# Patient Record
Sex: Female | Born: 1970 | Race: White | Hispanic: No | Marital: Married | Smoking: Never smoker
Health system: Southern US, Community
[De-identification: ages and names within clinical notes are randomized; demographics above are authoritative.]

## PROBLEM LIST (undated history)

## (undated) DIAGNOSIS — B019 Varicella without complication: Secondary | ICD-10-CM

## (undated) DIAGNOSIS — I10 Essential (primary) hypertension: Secondary | ICD-10-CM

## (undated) DIAGNOSIS — E785 Hyperlipidemia, unspecified: Secondary | ICD-10-CM

## (undated) DIAGNOSIS — F419 Anxiety disorder, unspecified: Secondary | ICD-10-CM

## (undated) DIAGNOSIS — E119 Type 2 diabetes mellitus without complications: Secondary | ICD-10-CM

## (undated) DIAGNOSIS — J45909 Unspecified asthma, uncomplicated: Secondary | ICD-10-CM

## (undated) HISTORY — DX: Type 2 diabetes mellitus without complications: E11.9

## (undated) HISTORY — DX: Essential (primary) hypertension: I10

## (undated) HISTORY — PX: POLYPECTOMY: SHX149

## (undated) HISTORY — DX: Varicella without complication: B01.9

## (undated) HISTORY — DX: Hyperlipidemia, unspecified: E78.5

## (undated) HISTORY — DX: Unspecified asthma, uncomplicated: J45.909

---

## 2009-08-26 ENCOUNTER — Emergency Department: Payer: Self-pay | Admitting: Emergency Medicine

## 2009-09-08 ENCOUNTER — Ambulatory Visit: Payer: Self-pay

## 2009-10-01 ENCOUNTER — Ambulatory Visit: Payer: Self-pay

## 2010-05-03 HISTORY — PX: POLYPECTOMY: SHX149

## 2010-09-09 ENCOUNTER — Ambulatory Visit: Payer: Self-pay | Admitting: Obstetrics and Gynecology

## 2010-09-21 ENCOUNTER — Ambulatory Visit: Payer: Self-pay | Admitting: Obstetrics and Gynecology

## 2010-09-24 ENCOUNTER — Ambulatory Visit: Payer: Self-pay | Admitting: Obstetrics and Gynecology

## 2010-09-29 LAB — PATHOLOGY REPORT

## 2012-08-01 ENCOUNTER — Ambulatory Visit: Payer: Self-pay | Admitting: Orthopedic Surgery

## 2012-08-21 ENCOUNTER — Emergency Department: Payer: Self-pay | Admitting: Emergency Medicine

## 2012-08-25 ENCOUNTER — Ambulatory Visit: Payer: Self-pay | Admitting: Orthopedic Surgery

## 2014-11-26 ENCOUNTER — Ambulatory Visit (INDEPENDENT_AMBULATORY_CARE_PROVIDER_SITE_OTHER): Payer: BC Managed Care – PPO | Admitting: Primary Care

## 2014-11-26 ENCOUNTER — Encounter: Payer: Self-pay | Admitting: Primary Care

## 2014-11-26 ENCOUNTER — Encounter (INDEPENDENT_AMBULATORY_CARE_PROVIDER_SITE_OTHER): Payer: Self-pay

## 2014-11-26 VITALS — BP 118/68 | HR 107 | Temp 98.7°F | Ht 60.0 in | Wt 266.8 lb

## 2014-11-26 DIAGNOSIS — F418 Other specified anxiety disorders: Secondary | ICD-10-CM

## 2014-11-26 DIAGNOSIS — R7303 Prediabetes: Secondary | ICD-10-CM

## 2014-11-26 DIAGNOSIS — E1165 Type 2 diabetes mellitus with hyperglycemia: Secondary | ICD-10-CM | POA: Insufficient documentation

## 2014-11-26 DIAGNOSIS — I10 Essential (primary) hypertension: Secondary | ICD-10-CM

## 2014-11-26 DIAGNOSIS — J45909 Unspecified asthma, uncomplicated: Secondary | ICD-10-CM | POA: Insufficient documentation

## 2014-11-26 DIAGNOSIS — E785 Hyperlipidemia, unspecified: Secondary | ICD-10-CM | POA: Diagnosis not present

## 2014-11-26 DIAGNOSIS — F411 Generalized anxiety disorder: Secondary | ICD-10-CM | POA: Insufficient documentation

## 2014-11-26 DIAGNOSIS — R7309 Other abnormal glucose: Secondary | ICD-10-CM | POA: Diagnosis not present

## 2014-11-26 DIAGNOSIS — E119 Type 2 diabetes mellitus without complications: Secondary | ICD-10-CM | POA: Insufficient documentation

## 2014-11-26 NOTE — Assessment & Plan Note (Signed)
Works as [employer] and will have situational anxiety occasionally during school year. No anxiety today. managed on 0.25 mg of Xanax by prior PCP prn. Will continue to monitor.

## 2014-11-26 NOTE — Assessment & Plan Note (Signed)
Managed on Hyzaar and amlodipine 5 mg. BP stable today. Denies headaches, dizziness, chest pain. Will continue to monitor. Labs next visit.

## 2014-11-26 NOTE — Progress Notes (Signed)
Pre visit review using our clinic review tool, if applicable. No additional management support is needed unless otherwise documented below in the visit note. 

## 2014-11-26 NOTE — Assessment & Plan Note (Signed)
Diagnosed several years ago. Mild. Uses albuterol inhaler once every 2-3 weeks. Asthma will flare during cold season. No complaints today. Will continue to monitor.

## 2014-11-26 NOTE — Assessment & Plan Note (Signed)
Endorses A1C of 6.1 on June 13th. Managed on glyburide 5 mg from prior PCP. Discussed importance of healthy diet and regular exercise. Will recheck A1C in mid September.

## 2014-11-26 NOTE — Progress Notes (Signed)
Subjective:    Patient ID: Destiny West, female    DOB: Feb 05, 1971, 44 y.o.   MRN: [id number]  HPI  Ms. Destiny West is a 44 year old female who presents today to establish care and discuss the problems mentioned below. Will obtain old records.  1) Borderline Diabetes: Diagnosed several years ago. Last A1C was 6.1 in June 13th 2016. Managed on glyburide 5 mg. She finds it difficult to eat a healthy diet and exercise.  Diet consists of: Breakfast: Banana Lunch: Yogurt, fruits, vegetables Dinner: Grilled chicken or salmon or ground beef, vegetables, rice, potatoes. Limited pasta consumption. Snacks: Chips, popcorn, fruit. Rarely eats sweets. Beverages: Water, seltzer.  She is currently not exercising. Back injury several years. She does have a fit bit and will do 10,000 steps daily when in school.   2) Hypertension: Diagnosed March 2015. Managed on Amlodipine 5 mg (added this May 2016) and Hyzarr 50/12.5 mg. Overall she feels well on these medication. Her BP on the Hyzarr alone was 140's/90. Denies dizziness, headaches, weakness.  3) Hyperlipidemia: Managed on simvastatin 20 mg. This was initiated in May of 2016. Denies myalgias.  4) Anxiety: Managed on 0.25 mg of Xanax as needed. 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 Prior history of Xanax use, was recently re-initiated on this medication. Does not take daily when in school. Is not taking during summer break.  5) Asthma: Mild. Worse when having a viral cold. She will have to use her inhaler once every 2 weeks on average.  Review of Systems  Constitutional: Negative for unexpected weight change.  HENT: Negative for rhinorrhea.   Respiratory: Negative for cough and shortness of breath.   Cardiovascular: Negative for chest pain.  Gastrointestinal: Negative for diarrhea and constipation.  Genitourinary: Negative for difficulty urinating.  Musculoskeletal: Negative for myalgias and arthralgias.     History of back pain. Has bulging disc at L4 and L5, no pain currently  Skin: Negative for rash.  Allergic/Immunologic: Positive for environmental allergies.  Neurological: Negative for dizziness and headaches.  Psychiatric/Behavioral:       See HPI. No concerns for depression       Past Medical History  Diagnosis Date  . Asthma   . Chicken pox   . Diabetes mellitus without complication     boderline  . Hyperlipidemia   . Hypertension     History   Social History  . Marital Status: Married    Spouse Name: N/A  . Number of Children: N/A  . Years of Education: N/A   Occupational History  . Not on file.   Social History Main Topics  . Smoking status: Never Smoker   . Smokeless tobacco: Not on file  . Alcohol Use: 0.0 oz/week    0 Standard drinks or equivalent per week     Comment: rarely  . Drug Use: Not on file  . Sexual Activity: Not on file   Other Topics Concern  . Not on file   Social History Narrative   Married.   No children.   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   Enjoys reading, traveling, movies.    Past Surgical History  Procedure Laterality Date  . Polypectomy      Uterine    Family History  Problem Relation Age of Onset  . Cancer Mother     breast  . Heart disease Father   . Hypertension Father   .  Heart disease Maternal Grandfather   . Hypertension Maternal Grandfather   . Diabetes Maternal Grandfather     No Known Allergies  No current outpatient prescriptions on file prior to visit.   No current facility-administered medications on file prior to visit.    BP 118/68 mmHg  Pulse 107  Temp(Src) 98.7 F (37.1 C) (Oral)  Ht 5' (1.524 m)  Wt 266 lb 12.8 oz (121.02 kg)  BMI 52.11 kg/m2  SpO2 98%  LMP 11/08/2014    Objective:   Physical Exam  Constitutional: She is oriented to person, place, and time. She appears well-nourished.  HENT:  Head: Normocephalic.  Cardiovascular: Normal rate and  regular rhythm.   Pulmonary/Chest: Effort normal and breath sounds normal. She has no wheezes.  Musculoskeletal: Normal range of motion.  Neurological: She is alert and oriented to person, place, and time.  Skin: Skin is warm and dry.  Psychiatric: She has a normal mood and affect.          Assessment & Plan:

## 2014-11-26 NOTE — Assessment & Plan Note (Signed)
Managed on simvastatin 20 mg.  Will repeat lipids in mid September. Discussed importance of healthy diet and regular exercise.

## 2014-11-26 NOTE — Patient Instructions (Addendum)
It is important that you improve your diet. Please limit carbohydrates in the form of white bread, rice, pasta, cakes, cookies, sugary drinks, etc. Increase your consumption of fresh fruits and vegetables. Be sure to drink plenty of water daily.  Schedule a lab appointment after September 13th to have your diabetes and cholesterol checked.   Schedule an appointment to meet with me 2-3 days later at your convenience so we can discuss your results and next steps.  It was a pleasure to meet you today! Please don't hesitate to call me with any questions. Welcome to [facility]!

## 2014-12-11 ENCOUNTER — Encounter: Payer: Self-pay | Admitting: Primary Care

## 2014-12-11 ENCOUNTER — Ambulatory Visit (INDEPENDENT_AMBULATORY_CARE_PROVIDER_SITE_OTHER): Payer: BC Managed Care – PPO | Admitting: Primary Care

## 2014-12-11 VITALS — BP 122/76 | HR 123 | Temp 98.4°F | Ht 60.0 in | Wt 264.8 lb

## 2014-12-11 DIAGNOSIS — M5489 Other dorsalgia: Secondary | ICD-10-CM

## 2014-12-11 NOTE — Progress Notes (Signed)
Pre visit review using our clinic review tool, if applicable. No additional management support is needed unless otherwise documented below in the visit note. 

## 2014-12-11 NOTE — Progress Notes (Signed)
Subjective:    Patient ID: Destiny West, female    DOB: Mar 15, 1971, 44 y.o.   MRN: [id number]  HPI  Destiny West is a 44 year old female who presents today with a chief complaint of back pain. Her pain is located to the left upper part of her back distal to scapula and has been present for 3 days. She woke up with this pain. She describes her pain as a "dull ache" and is now radiating across her back. Denies dysuria, frequency, urgency, recent injury. She's tried taking aleve without relief. She had a xanax tablet yesterday without relief. The pain is worse when she's moving around. She also reports intermittent nausea, and epigastric tenderness. She was once followed by cardiology and has not been seen in over 10 years.  Review of Systems  Constitutional: Negative for diaphoresis.  Respiratory: Negative for shortness of breath.   Cardiovascular: Negative for chest pain.  Gastrointestinal: Positive for nausea.  Musculoskeletal: Positive for back pain.  Psychiatric/Behavioral: The patient is nervous/anxious.        Past Medical History  Diagnosis Date  . Asthma   . Chicken pox   . Diabetes mellitus without complication     boderline  . Hyperlipidemia   . Hypertension     Social History   Social History  . Marital Status: Married    Spouse Name: N/A  . Number of Children: N/A  . Years of Education: N/A   Occupational History  . Not on file.   Social History Main Topics  . Smoking status: Never Smoker   . Smokeless tobacco: Not on file  . Alcohol Use: 0.0 oz/week    0 Standard drinks or equivalent per week     Comment: rarely  . Drug Use: Not on file  . Sexual Activity: Not on file   Other Topics Concern  . Not on file   Social History Narrative   Married.   No children.   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   Enjoys reading, traveling, movies.    Past Surgical History  Procedure Laterality Date  . Polypectomy      Uterine     Family History  Problem Relation Age of Onset  . Cancer Mother     breast  . Heart disease Father   . Hypertension Father   . Heart disease Maternal Grandfather   . Hypertension Maternal Grandfather   . Diabetes Maternal Grandfather     No Known Allergies  Current Outpatient Prescriptions on File Prior to Visit  Medication Sig Dispense Refill  . albuterol (VENTOLIN HFA) 108 (90 BASE) MCG/ACT inhaler Inhale 1 puff into the lungs as needed for wheezing or shortness of breath.    . ALPRAZolam (XANAX) 0.25 MG tablet Take 0.25 mg by mouth daily as needed.  1  . amLODipine (NORVASC) 5 MG tablet TAKE 1 TABLET (5 MG) BY ORAL ROUTE ONCE DAILY  5  . glyBURIDE (DIABETA) 5 MG tablet Take 2.5 mg by mouth 2 (two) times daily.  6  . losartan-hydrochlorothiazide (HYZAAR) 50-12.5 MG per tablet TAKE 2 TABLETS BY ORAL ROUTE ONCE DAILY  5  . simvastatin (ZOCOR) 20 MG tablet TAKE 1 TABLET (20 MG) BY MOUTH ONCE DAILY IN THE EVENING  1   No current facility-administered medications on file prior to visit.    BP 122/76 mmHg  Pulse 123  Temp(Src) 98.4 F (36.9 C) (Oral)  Ht 5' (1.524 m)  Wt  264 lb 12.8 oz (120.112 kg)  BMI 51.71 kg/m2  SpO2 98%  LMP 12/09/2014    Objective:   Physical Exam  Constitutional: She appears well-nourished.  Cardiovascular: Regular rhythm.   Sinus tachycardia at 108 during exam  Pulmonary/Chest: Effort normal and breath sounds normal.  Musculoskeletal:  Tenderness upon palpation to left upper back distal to scapula.   Skin: Skin is warm and dry.          Assessment & Plan:  Back pain:  Present for past 3 days, located to posterior trunk distal to scapula on left side. Tender upon exam. She also endorses nausea, FH of MI, and was once followed by cardiology. She is also anxious as she is starting back up with school this week ECG: Sinus tach at 104. No PVC's, PAC's, St elevation, inverted t-waves. Ibuprofen 600 mg TID PRN pain. Follow up if she  develops chest pain, diaphoresis, increased nausea.

## 2014-12-11 NOTE — Patient Instructions (Signed)
You may take ibuprofen 600 mg three times daily as needed for pain.  Your ECG (picture of your heart) did not show any abnormalities.   Please notify me if your pain does not improve in the next 2-3 weeks, or if you develop chest pain, nausea, sweats.  It was a pleasure to see you today!

## 2015-01-20 ENCOUNTER — Other Ambulatory Visit: Payer: Self-pay | Admitting: Primary Care

## 2015-01-20 DIAGNOSIS — R7303 Prediabetes: Secondary | ICD-10-CM

## 2015-01-20 DIAGNOSIS — I10 Essential (primary) hypertension: Secondary | ICD-10-CM

## 2015-01-20 DIAGNOSIS — E785 Hyperlipidemia, unspecified: Secondary | ICD-10-CM

## 2015-01-28 ENCOUNTER — Other Ambulatory Visit (INDEPENDENT_AMBULATORY_CARE_PROVIDER_SITE_OTHER): Payer: BC Managed Care – PPO

## 2015-01-28 DIAGNOSIS — I10 Essential (primary) hypertension: Secondary | ICD-10-CM | POA: Diagnosis not present

## 2015-01-28 DIAGNOSIS — R7309 Other abnormal glucose: Secondary | ICD-10-CM | POA: Diagnosis not present

## 2015-01-28 DIAGNOSIS — E785 Hyperlipidemia, unspecified: Secondary | ICD-10-CM | POA: Diagnosis not present

## 2015-01-28 DIAGNOSIS — R7303 Prediabetes: Secondary | ICD-10-CM

## 2015-01-28 LAB — LIPID PANEL
CHOL/HDL RATIO: 6
Cholesterol: 229 mg/dL — ABNORMAL HIGH (ref 0–200)
HDL: 41.5 mg/dL (ref >39.00)
LDL CALC: 153 mg/dL — AB (ref 0–99)
NonHDL: 187.74
TRIGLYCERIDES: 176 mg/dL — AB (ref 0.0–149.0)
VLDL: 35.2 mg/dL (ref 0.0–40.0)

## 2015-01-28 LAB — BASIC METABOLIC PANEL
BUN: 11 mg/dL (ref 6–23)
CALCIUM: 9.4 mg/dL (ref 8.4–10.5)
CO2: 29 meq/L (ref 19–32)
Chloride: 101 mEq/L (ref 96–112)
Creatinine, Ser: 0.67 mg/dL (ref 0.40–1.20)
GFR: 102.45 mL/min (ref >60.00)
GLUCOSE: 127 mg/dL — AB (ref 70–99)
Potassium: 3.7 mEq/L (ref 3.5–5.1)
Sodium: 139 mEq/L (ref 135–145)

## 2015-01-28 LAB — HEMOGLOBIN A1C: Hgb A1c MFr Bld: 6.3 % (ref 4.6–6.5)

## 2015-01-31 ENCOUNTER — Ambulatory Visit (INDEPENDENT_AMBULATORY_CARE_PROVIDER_SITE_OTHER): Payer: BC Managed Care – PPO | Admitting: Primary Care

## 2015-01-31 ENCOUNTER — Encounter: Payer: Self-pay | Admitting: Primary Care

## 2015-01-31 VITALS — BP 120/78 | HR 81 | Temp 98.1°F | Ht 60.0 in | Wt 264.1 lb

## 2015-01-31 DIAGNOSIS — R7309 Other abnormal glucose: Secondary | ICD-10-CM | POA: Diagnosis not present

## 2015-01-31 DIAGNOSIS — E785 Hyperlipidemia, unspecified: Secondary | ICD-10-CM

## 2015-01-31 DIAGNOSIS — I10 Essential (primary) hypertension: Secondary | ICD-10-CM | POA: Diagnosis not present

## 2015-01-31 DIAGNOSIS — R7303 Prediabetes: Secondary | ICD-10-CM

## 2015-01-31 MED ORDER — SIMVASTATIN 20 MG PO TABS: ORAL_TABLET | ORAL | Status: DC

## 2015-01-31 NOTE — Assessment & Plan Note (Signed)
Lipid panel elevated. Has not been on meds for 3+ weeks. Refills sent to pharmacy. Repeat in 3 months.

## 2015-01-31 NOTE — Assessment & Plan Note (Signed)
Stable on Hyzaar and amlodipine. Continue current regimen. Continue efforts of weight loss.

## 2015-01-31 NOTE — Patient Instructions (Signed)
Continue your efforts towards a healthy lifestyle through diet and exercise.  Refills have been sent for your cholesterol medication.  Please schedule a physical with me in the next 3 months. You will also schedule a lab only appointment one week prior (after December 27th). We will discuss your lab results during your physical.  It was a pleasure to see you today!

## 2015-01-31 NOTE — Progress Notes (Signed)
Subjective:    Patient ID: Destiny West, female    DOB: 12-Jun-1970, 44 y.o.   MRN: [id number]  HPI  Destiny West is a 44 year old female who presents today for follow up.  1) Borderline Diabetes: A1C of 6.2 on June 13th 2016. She is currently managed on glyburide 2.5 mg BID from prior PCP. She is working to improve her diet by eating vegetables, fruits, and lean meats. She's also joined a gym and has started working out. A1C of 6.3 from 01/28/15. Denies numbness/tingling, dizziness.  Her diet currently consists of: Breakfast: 2 hard boiled eggs Snack: Fruit Lunch: Salad with chicken, veggies, yogurt Snack: Fruit Dinner: Salad, lean meats veggies Beverages: Water or seltzer, one diet soda per week. Desserts: Rarely  Exercise: Started several weeks ago and will do 45 minutes on the treadmill 4-5 times weekly. She is getting 12,000 steps daily on her fitness tracker.  Wt Readings from Last 3 Encounters:  01/31/15 264 lb 1.9 oz (119.804 kg)  12/11/14 264 lb 12.8 oz (120.112 kg)  11/26/14 266 lb 12.8 oz (121.02 kg)     2) Essential Hypertension: Managed on Hyzaar and amlodipine 5 mg.  Denies chest pain, headaches, shortness of breath.  3) Hyperlipidemia: Currently managed on Simvastatin 20 mg at night. She has been out of her medication for 3 weeks. Lipid panel elevated from 01/28/15. She is working towards a healthy lifestyle. Denies myalgias.  Review of Systems  Respiratory: Negative for shortness of breath.   Cardiovascular: Negative for chest pain.  Musculoskeletal: Negative for arthralgias.  Neurological: Negative for dizziness, numbness and headaches.       Past Medical History  Diagnosis Date  . Asthma   . Chicken pox   . Diabetes mellitus without complication     boderline  . Hyperlipidemia   . Hypertension     Social History   Social History  . Marital Status: Married    Spouse Name: N/A  . Number of Children: N/A  . Years of Education: N/A   Occupational  History  . Not on file.   Social History Main Topics  . Smoking status: Never Smoker   . Smokeless tobacco: Not on file  . Alcohol Use: 0.0 oz/week    0 Standard drinks or equivalent per week     Comment: rarely  . Drug Use: Not on file  . Sexual Activity: Not on file   Other Topics Concern  . Not on file   Social History Narrative   Married.   No children.   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   Enjoys reading, traveling, movies.    Past Surgical History  Procedure Laterality Date  . Polypectomy      Uterine    Family History  Problem Relation Age of Onset  . Cancer Mother     breast  . Heart disease Father   . Hypertension Father   . Heart disease Maternal Grandfather   . Hypertension Maternal Grandfather   . Diabetes Maternal Grandfather     No Known Allergies  Current Outpatient Prescriptions on File Prior to Visit  Medication Sig Dispense Refill  . albuterol (VENTOLIN HFA) 108 (90 BASE) MCG/ACT inhaler Inhale 1 puff into the lungs as needed for wheezing or shortness of breath.    . ALPRAZolam (XANAX) 0.25 MG tablet Take 0.25 mg by mouth daily as needed.  1  . amLODipine (NORVASC) 5 MG tablet TAKE 1 TABLET (5  MG) BY ORAL ROUTE ONCE DAILY  5  . glyBURIDE (DIABETA) 5 MG tablet Take 2.5 mg by mouth 2 (two) times daily.  6  . losartan-hydrochlorothiazide (HYZAAR) 50-12.5 MG per tablet TAKE 2 TABLETS BY ORAL ROUTE ONCE DAILY  5   No current facility-administered medications on file prior to visit.    BP 120/78 mmHg  Pulse 81  Temp(Src) 98.1 F (36.7 C) (Oral)  Ht 5' (1.524 m)  Wt 264 lb 1.9 oz (119.804 kg)  BMI 51.58 kg/m2  SpO2 99%  LMP 01/13/2015    Objective:   Physical Exam  Constitutional: She appears well-nourished.  Cardiovascular: Normal rate and regular rhythm.   Pulmonary/Chest: Effort normal and breath sounds normal.  Skin: Skin is warm and dry.          Assessment & Plan:

## 2015-01-31 NOTE — Progress Notes (Signed)
Pre visit review using our clinic review tool, if applicable. No additional management support is needed unless otherwise documented below in the visit note. 

## 2015-01-31 NOTE — Assessment & Plan Note (Signed)
A1C of 6.3 on recent recheck. She is working towards healthy lifestyle. Continue glyburide 2.5 mg BID. Will recheck in 3 months during physical.

## 2015-04-10 ENCOUNTER — Encounter: Payer: Self-pay | Admitting: Primary Care

## 2015-04-10 ENCOUNTER — Other Ambulatory Visit: Payer: Self-pay | Admitting: Primary Care

## 2015-04-10 DIAGNOSIS — I10 Essential (primary) hypertension: Secondary | ICD-10-CM

## 2015-04-10 MED ORDER — AMLODIPINE BESYLATE 5 MG PO TABS: 5.0000 mg | ORAL_TABLET | Freq: Every day | ORAL | Status: DC

## 2015-04-17 ENCOUNTER — Other Ambulatory Visit: Payer: Self-pay | Admitting: Primary Care

## 2015-04-17 DIAGNOSIS — I10 Essential (primary) hypertension: Secondary | ICD-10-CM

## 2015-04-17 DIAGNOSIS — R7303 Prediabetes: Secondary | ICD-10-CM

## 2015-04-17 DIAGNOSIS — E785 Hyperlipidemia, unspecified: Secondary | ICD-10-CM

## 2015-04-17 DIAGNOSIS — Z0000 Encounter for general adult medical examination without abnormal findings: Secondary | ICD-10-CM

## 2015-05-01 ENCOUNTER — Other Ambulatory Visit (INDEPENDENT_AMBULATORY_CARE_PROVIDER_SITE_OTHER): Payer: BC Managed Care – PPO

## 2015-05-01 DIAGNOSIS — I10 Essential (primary) hypertension: Secondary | ICD-10-CM

## 2015-05-01 DIAGNOSIS — Z0000 Encounter for general adult medical examination without abnormal findings: Secondary | ICD-10-CM | POA: Diagnosis not present

## 2015-05-01 DIAGNOSIS — R7303 Prediabetes: Secondary | ICD-10-CM

## 2015-05-01 DIAGNOSIS — E785 Hyperlipidemia, unspecified: Secondary | ICD-10-CM | POA: Diagnosis not present

## 2015-05-01 LAB — COMPREHENSIVE METABOLIC PANEL
ALBUMIN: 4.1 g/dL (ref 3.5–5.2)
ALT: 35 U/L (ref 0–35)
AST: 23 U/L (ref 0–37)
Alkaline Phosphatase: 47 U/L (ref 39–117)
BUN: 11 mg/dL (ref 6–23)
CALCIUM: 9.8 mg/dL (ref 8.4–10.5)
CHLORIDE: 103 meq/L (ref 96–112)
CO2: 28 meq/L (ref 19–32)
Creatinine, Ser: 0.71 mg/dL (ref 0.40–1.20)
GFR: 95.7 mL/min (ref >60.00)
Glucose, Bld: 133 mg/dL — ABNORMAL HIGH (ref 70–99)
POTASSIUM: 3.6 meq/L (ref 3.5–5.1)
Sodium: 142 mEq/L (ref 135–145)
Total Bilirubin: 0.3 mg/dL (ref 0.2–1.2)
Total Protein: 7.4 g/dL (ref 6.0–8.3)

## 2015-05-01 LAB — LIPID PANEL
CHOLESTEROL: 183 mg/dL (ref 0–200)
HDL: 43.7 mg/dL (ref >39.00)
LDL CALC: 106 mg/dL — AB (ref 0–99)
NonHDL: 138.96
TRIGLYCERIDES: 164 mg/dL — AB (ref 0.0–149.0)
Total CHOL/HDL Ratio: 4
VLDL: 32.8 mg/dL (ref 0.0–40.0)

## 2015-05-01 LAB — VITAMIN D 25 HYDROXY (VIT D DEFICIENCY, FRACTURES): VITD: 34.74 ng/mL (ref 30.00–100.00)

## 2015-05-01 LAB — CBC
HEMATOCRIT: 40.9 % (ref 36.0–46.0)
HEMOGLOBIN: 13.5 g/dL (ref 12.0–15.0)
MCHC: 32.9 g/dL (ref 30.0–36.0)
MCV: 80 fl (ref 78.0–100.0)
PLATELETS: 353 10*3/uL (ref 150.0–400.0)
RBC: 5.12 Mil/uL — AB (ref 3.87–5.11)
RDW: 14.9 % (ref 11.5–15.5)
WBC: 10.6 10*3/uL — ABNORMAL HIGH (ref 4.0–10.5)

## 2015-05-01 LAB — TSH: TSH: 1.65 u[IU]/mL (ref 0.35–4.50)

## 2015-05-01 LAB — HEMOGLOBIN A1C: HEMOGLOBIN A1C: 6.3 % (ref 4.6–6.5)

## 2015-05-07 ENCOUNTER — Encounter: Payer: BC Managed Care – PPO | Admitting: Primary Care

## 2015-05-09 ENCOUNTER — Ambulatory Visit (INDEPENDENT_AMBULATORY_CARE_PROVIDER_SITE_OTHER): Payer: BC Managed Care – PPO | Admitting: Primary Care

## 2015-05-09 ENCOUNTER — Encounter: Payer: Self-pay | Admitting: Primary Care

## 2015-05-09 VITALS — BP 120/78 | HR 107 | Temp 97.9°F | Ht 60.0 in | Wt 262.8 lb

## 2015-05-09 DIAGNOSIS — I10 Essential (primary) hypertension: Secondary | ICD-10-CM

## 2015-05-09 DIAGNOSIS — R7303 Prediabetes: Secondary | ICD-10-CM | POA: Diagnosis not present

## 2015-05-09 DIAGNOSIS — E785 Hyperlipidemia, unspecified: Secondary | ICD-10-CM

## 2015-05-09 DIAGNOSIS — Z0000 Encounter for general adult medical examination without abnormal findings: Secondary | ICD-10-CM | POA: Insufficient documentation

## 2015-05-09 DIAGNOSIS — Z0001 Encounter for general adult medical examination with abnormal findings: Secondary | ICD-10-CM | POA: Insufficient documentation

## 2015-05-09 DIAGNOSIS — Z23 Encounter for immunization: Secondary | ICD-10-CM

## 2015-05-09 MED ORDER — LOSARTAN POTASSIUM-HCTZ 50-12.5 MG PO TABS: ORAL_TABLET | ORAL | Status: DC

## 2015-05-09 MED ORDER — GLYBURIDE 5 MG PO TABS: 2.5000 mg | ORAL_TABLET | Freq: Two times a day (BID) | ORAL | Status: DC

## 2015-05-09 NOTE — Progress Notes (Signed)
Pre visit review using our clinic review tool, if applicable. No additional management support is needed unless otherwise documented below in the visit note. 

## 2015-05-09 NOTE — Assessment & Plan Note (Signed)
A1C of 6.3 today, same as previous. Discussed the importance of a healthy diet and regular exercise in order for weight loss and to reduce risk of other medical diseases. Will continue to monitor. Continue Glyburide.

## 2015-05-09 NOTE — Assessment & Plan Note (Signed)
Stable today. Continue current regimen. 

## 2015-05-09 NOTE — Progress Notes (Signed)
Subjective:    Patient ID: Destiny West, female    DOB: 06/09/1970, 45 y.o.   MRN: [id number]  HPI  Destiny West is a 45 year old who presents today for complete physical.  Immunizations: -Tetanus: Unsure, believes it's been over 10 years.  -Influenza: Declines   Diet: Endorses poor diet. Breakfast: 2 hard boiled eggs Lunch: Salad, skips sometimes Dinner: Grilled chicken, pork chops, salads Snacks: Junk food Desserts: None Beverages: Water or seltzer, 1 diet soda a week  Exercise: She is not currently exercise. Eye exam: Completed over 1 year ago. Will make appointment. Dental exam: One year ago, due again. Pap Smear: Completed in May 2016 Mammogram: Completed in May 2016   Review of Systems  Constitutional: Negative for unexpected weight change.  HENT: Positive for rhinorrhea.   Respiratory: Negative for cough and shortness of breath.   Cardiovascular: Negative for chest pain.  Gastrointestinal: Negative for diarrhea and constipation.  Genitourinary: Negative for difficulty urinating.  Musculoskeletal: Negative for myalgias and arthralgias.  Skin: Negative for rash.  Neurological: Negative for dizziness, numbness and headaches.  Psychiatric/Behavioral:       Denies concerns for anxiety or depression       Past Medical History  Diagnosis Date  . Asthma   . Chicken pox   . Diabetes mellitus without complication ([place])     boderline  . Hyperlipidemia   . Hypertension     Social History   Social History  . Marital Status: Married    Spouse Name: N/A  . Number of Children: N/A  . Years of Education: N/A   Occupational History  . Not on file.   Social History Main Topics  . Smoking status: Never Smoker   . Smokeless tobacco: Not on file  . Alcohol Use: 0.0 oz/week    0 Standard drinks or equivalent per week     Comment: rarely  . Drug Use: Not on file  . Sexual Activity: Not on file   Other Topics Concern  . Not on file   Social History Narrative   Married.   No children.   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   Enjoys reading, traveling, movies.    Past Surgical History  Procedure Laterality Date  . Polypectomy      Uterine    Family History  Problem Relation Age of Onset  . Cancer Mother     breast  . Heart disease Father   . Hypertension Father   . Heart disease Maternal Grandfather   . Hypertension Maternal Grandfather   . Diabetes Maternal Grandfather     No Known Allergies  Current Outpatient Prescriptions on File Prior to Visit  Medication Sig Dispense Refill  . albuterol (VENTOLIN HFA) 108 (90 BASE) MCG/ACT inhaler Inhale 1 puff into the lungs as needed for wheezing or shortness of breath.    . ALPRAZolam (XANAX) 0.25 MG tablet Take 0.25 mg by mouth daily as needed.  1  . amLODipine (NORVASC) 5 MG tablet Take 1 tablet (5 mg total) by mouth daily. 90 tablet 2  . simvastatin (ZOCOR) 20 MG tablet TAKE 1 TABLET (20 MG) BY MOUTH ONCE DAILY IN THE EVENING 90 tablet 1   No current facility-administered medications on file prior to visit.    BP 120/78 mmHg  Pulse 107  Temp(Src) 97.9 F (36.6 C) (Oral)  Ht 5' (1.524 m)  Wt 262 lb 12.8 oz (119.205 kg)  BMI 51.32 kg/m2  SpO2 98%  LMP 04/29/2015    Objective:   Physical Exam  Constitutional: She is oriented to person, place, and time. She appears well-nourished.  HENT:  Right Ear: Tympanic membrane and ear canal normal.  Left Ear: Tympanic membrane and ear canal normal.  Nose: Nose normal.  Mouth/Throat: Oropharynx is clear and moist.  Eyes: Conjunctivae and EOM are normal. Pupils are equal, round, and reactive to light.  Neck: Neck supple. No thyromegaly present.  Cardiovascular: Normal rate and regular rhythm.   No murmur heard. Pulmonary/Chest: Effort normal and breath sounds normal. She has no rales.  Abdominal: Soft. Bowel sounds are normal. There is no tenderness.  Musculoskeletal: Normal range of motion.    Lymphadenopathy:    She has no cervical adenopathy.  Neurological: She is alert and oriented to person, place, and time. She has normal reflexes. No cranial nerve deficit.  Skin: Skin is warm and dry. No rash noted.  Psychiatric: She has a normal mood and affect.          Assessment & Plan:

## 2015-05-09 NOTE — Patient Instructions (Addendum)
Start taking Fish Oil 2000 mg daily. This may be purchased over the counter.  Start taking 4000 units of vitamin D daily.  Schedule a lab only appointment in 3 months for re-check of cholesterol and diabetes.  It is important that you improve your diet. Please limit carbohydrates in the form of white bread, rice, pasta, cakes, cookies, sugary drinks, etc. Increase your consumption of fresh fruits and vegetables.  You need to consume about 2 liters of water daily.  Start exercising. You should be getting 1 hour of moderate intensity exercise 5 days weekly.  Follow up in 6 months for follow up of medical conditions.  It was a pleasure to see you today!

## 2015-05-09 NOTE — Assessment & Plan Note (Signed)
Well controlled overall, except for trigs. Will have her start Fish Oil today. Considered increasing statin, however patient declines and would like to take fish oil and work on diet. Will repeat in 6 months.

## 2015-05-09 NOTE — Assessment & Plan Note (Signed)
Tdap due and provided today. Declines flu. Diet mostly good except for snacks. Discussed the importance of a healthy diet and regular exercise in order for weight loss and to reduce risk of other medical diseases. Exam unremarkable. Follow up in 1 year for repeat physical.

## 2015-07-27 ENCOUNTER — Other Ambulatory Visit: Payer: Self-pay | Admitting: Primary Care

## 2015-08-12 ENCOUNTER — Other Ambulatory Visit (INDEPENDENT_AMBULATORY_CARE_PROVIDER_SITE_OTHER): Payer: BC Managed Care – PPO

## 2015-08-12 DIAGNOSIS — E785 Hyperlipidemia, unspecified: Secondary | ICD-10-CM | POA: Diagnosis not present

## 2015-08-12 DIAGNOSIS — R7303 Prediabetes: Secondary | ICD-10-CM | POA: Diagnosis not present

## 2015-08-12 LAB — LIPID PANEL
CHOL/HDL RATIO: 5
CHOLESTEROL: 184 mg/dL (ref 0–200)
HDL: 37 mg/dL — AB (ref >39.00)
NONHDL: 146.79
TRIGLYCERIDES: 229 mg/dL — AB (ref 0.0–149.0)
VLDL: 45.8 mg/dL — ABNORMAL HIGH (ref 0.0–40.0)

## 2015-08-12 LAB — HEMOGLOBIN A1C: Hgb A1c MFr Bld: 6.4 % (ref 4.6–6.5)

## 2015-08-12 LAB — LDL CHOLESTEROL, DIRECT: Direct LDL: 110 mg/dL

## 2015-08-13 ENCOUNTER — Encounter: Payer: Self-pay | Admitting: Primary Care

## 2015-08-19 ENCOUNTER — Telehealth: Payer: Self-pay | Admitting: Primary Care

## 2015-08-19 NOTE — Telephone Encounter (Signed)
Patient returned Chan's call.  Patient said she did receive Kate's message about her lab work.  Patient scheduled lab appointment on 11/18/15.  I let patient know if there was anything else Vallarie Mare wanted to discuss with patient, she'd call her back.

## 2015-10-25 ENCOUNTER — Other Ambulatory Visit: Payer: Self-pay | Admitting: Primary Care

## 2015-11-11 ENCOUNTER — Other Ambulatory Visit: Payer: Self-pay | Admitting: Primary Care

## 2015-11-11 DIAGNOSIS — E785 Hyperlipidemia, unspecified: Secondary | ICD-10-CM

## 2015-11-11 DIAGNOSIS — R7303 Prediabetes: Secondary | ICD-10-CM

## 2015-11-18 ENCOUNTER — Other Ambulatory Visit (INDEPENDENT_AMBULATORY_CARE_PROVIDER_SITE_OTHER): Payer: BC Managed Care – PPO

## 2015-11-18 DIAGNOSIS — E785 Hyperlipidemia, unspecified: Secondary | ICD-10-CM | POA: Diagnosis not present

## 2015-11-18 DIAGNOSIS — R7303 Prediabetes: Secondary | ICD-10-CM | POA: Diagnosis not present

## 2015-11-18 LAB — LIPID PANEL
CHOL/HDL RATIO: 4
Cholesterol: 173 mg/dL (ref 0–200)
HDL: 43.8 mg/dL (ref >39.00)
LDL CALC: 98 mg/dL (ref 0–99)
NONHDL: 129.59
Triglycerides: 156 mg/dL — ABNORMAL HIGH (ref 0.0–149.0)
VLDL: 31.2 mg/dL (ref 0.0–40.0)

## 2015-11-18 LAB — HEMOGLOBIN A1C: Hgb A1c MFr Bld: 6.2 % (ref 4.6–6.5)

## 2015-12-27 ENCOUNTER — Other Ambulatory Visit: Payer: Self-pay | Admitting: Primary Care

## 2015-12-27 DIAGNOSIS — I10 Essential (primary) hypertension: Secondary | ICD-10-CM

## 2016-04-18 ENCOUNTER — Other Ambulatory Visit: Payer: Self-pay | Admitting: Primary Care

## 2016-04-22 ENCOUNTER — Other Ambulatory Visit: Payer: Self-pay | Admitting: Primary Care

## 2016-04-22 DIAGNOSIS — I10 Essential (primary) hypertension: Secondary | ICD-10-CM

## 2016-04-30 ENCOUNTER — Other Ambulatory Visit: Payer: Self-pay | Admitting: Primary Care

## 2016-04-30 DIAGNOSIS — E782 Mixed hyperlipidemia: Secondary | ICD-10-CM

## 2016-04-30 DIAGNOSIS — R7303 Prediabetes: Secondary | ICD-10-CM

## 2016-05-14 ENCOUNTER — Other Ambulatory Visit (INDEPENDENT_AMBULATORY_CARE_PROVIDER_SITE_OTHER): Payer: BC Managed Care – PPO

## 2016-05-14 DIAGNOSIS — R7303 Prediabetes: Secondary | ICD-10-CM | POA: Diagnosis not present

## 2016-05-14 DIAGNOSIS — E782 Mixed hyperlipidemia: Secondary | ICD-10-CM

## 2016-05-14 LAB — COMPREHENSIVE METABOLIC PANEL
ALBUMIN: 4.3 g/dL (ref 3.5–5.2)
ALK PHOS: 39 U/L (ref 39–117)
ALT: 33 U/L (ref 0–35)
AST: 26 U/L (ref 0–37)
BUN: 9 mg/dL (ref 6–23)
CALCIUM: 9.5 mg/dL (ref 8.4–10.5)
CHLORIDE: 103 meq/L (ref 96–112)
CO2: 32 mEq/L (ref 19–32)
CREATININE: 0.74 mg/dL (ref 0.40–1.20)
GFR: 90.79 mL/min (ref >60.00)
Glucose, Bld: 110 mg/dL — ABNORMAL HIGH (ref 70–99)
Potassium: 3.7 mEq/L (ref 3.5–5.1)
Sodium: 142 mEq/L (ref 135–145)
Total Bilirubin: 0.4 mg/dL (ref 0.2–1.2)
Total Protein: 7.2 g/dL (ref 6.0–8.3)

## 2016-05-14 LAB — LIPID PANEL
CHOL/HDL RATIO: 4
CHOLESTEROL: 157 mg/dL (ref 0–200)
HDL: 36.8 mg/dL — ABNORMAL LOW (ref >39.00)
LDL CALC: 96 mg/dL (ref 0–99)
NonHDL: 120.63
TRIGLYCERIDES: 125 mg/dL (ref 0.0–149.0)
VLDL: 25 mg/dL (ref 0.0–40.0)

## 2016-05-14 LAB — HEMOGLOBIN A1C: HEMOGLOBIN A1C: 6 % (ref 4.6–6.5)

## 2016-05-21 ENCOUNTER — Encounter: Payer: Self-pay | Admitting: Primary Care

## 2016-05-21 ENCOUNTER — Ambulatory Visit (INDEPENDENT_AMBULATORY_CARE_PROVIDER_SITE_OTHER): Payer: BC Managed Care – PPO | Admitting: Primary Care

## 2016-05-21 VITALS — BP 124/86 | HR 105 | Temp 98.0°F | Ht 60.0 in | Wt 251.4 lb

## 2016-05-21 DIAGNOSIS — J4520 Mild intermittent asthma, uncomplicated: Secondary | ICD-10-CM

## 2016-05-21 DIAGNOSIS — Z0000 Encounter for general adult medical examination without abnormal findings: Secondary | ICD-10-CM

## 2016-05-21 DIAGNOSIS — I10 Essential (primary) hypertension: Secondary | ICD-10-CM | POA: Diagnosis not present

## 2016-05-21 DIAGNOSIS — F418 Other specified anxiety disorders: Secondary | ICD-10-CM | POA: Diagnosis not present

## 2016-05-21 DIAGNOSIS — E785 Hyperlipidemia, unspecified: Secondary | ICD-10-CM

## 2016-05-21 DIAGNOSIS — R7303 Prediabetes: Secondary | ICD-10-CM

## 2016-05-21 MED ORDER — ALBUTEROL SULFATE HFA 108 (90 BASE) MCG/ACT IN AERS: 2.0000 | INHALATION_SPRAY | Freq: Four times a day (QID) | RESPIRATORY_TRACT | 3 refills | Status: DC | PRN

## 2016-05-21 NOTE — Assessment & Plan Note (Signed)
Infrequent use, not prescribed by our practice as of yet. continue to monitor.

## 2016-05-21 NOTE — Assessment & Plan Note (Signed)
Td UTD, declines influenza vaccination. Discussed the importance of a healthy diet and regular exercise in order for weight loss, and to reduce the risk of other medical diseases. Congratulated her on her weight loss and encouraged her to continue. Exam unremarkable. Labs improved. Continue to monitor A1C. Follow up in 1 year for annual exam.

## 2016-05-21 NOTE — Patient Instructions (Signed)
Congratulations on your weight loss, keep up the great work!  Schedule your mammogram as discussed.  Start exercising. You should be getting 150 minutes of moderate intensity exercise weekly.  Ensure you are consuming 64 ounces of water daily.  Schedule a lab only appointment in 6 months to recheck your blood sugar (A1C). You do not need to be fasting for this appointment.  Follow up in 1 year for annual physical or sooner if needed.  It was a pleasure to see you today!

## 2016-05-21 NOTE — Assessment & Plan Note (Signed)
Improved, continue weight loss through weight watchers. Start exercising. Continue to monitor.

## 2016-05-21 NOTE — Assessment & Plan Note (Signed)
Infrequent use of albuterol, needs to switch from Ventolin as insurance will no longer cover. Rx for Proair sent to pharmacy.

## 2016-05-21 NOTE — Assessment & Plan Note (Signed)
Improvement of A1C to 6.0. Repeat in 6 months, will continue to monitor.

## 2016-05-21 NOTE — Assessment & Plan Note (Signed)
Stable today, continue Amlodipine and Hyzaar.

## 2016-05-21 NOTE — Progress Notes (Signed)
Subjective:    Patient ID: Destiny West, female    DOB: 1970-12-29, 46 y.o.   MRN: [id number]  HPI  Destiny West is a 46 year old female who presents today for complete physical.  Immunizations: -Tetanus: Completed in 2017 -Influenza: Declines   Diet: She started on weight watchers in October 2017. Breakfast: Eggs Lunch: Left overs, salad with protein, tuna, soup Dinner: Meat, vegetables, whole grains Snacks: Fruit, toast with avocado, veggies and hummus Desserts: Rarely Beverages: Flavored seltzer, water, occasional diet sode  Exercise: She does not routinely exercise. She is active at work. Eye exam: Completed in May 2017, returned again in November 2017 Dental exam: Completes annually Pap Smear: Completed in 2016, follows with GYN. Mammogram: Completed in 2016. Follows with GYN.  Wt Readings from Last 3 Encounters:  05/21/16 251 lb 6.4 oz (114 kg)  05/09/15 262 lb 12.8 oz (119.2 kg)  01/31/15 264 lb 1.9 oz (119.8 kg)      Review of Systems  Constitutional: Negative for unexpected weight change.  HENT: Negative for rhinorrhea.   Respiratory: Negative for cough and shortness of breath.   Cardiovascular: Negative for chest pain.  Gastrointestinal: Negative for constipation and diarrhea.  Genitourinary: Negative for difficulty urinating and menstrual problem.  Musculoskeletal: Negative for arthralgias and myalgias.  Skin: Negative for rash.  Allergic/Immunologic: Negative for environmental allergies.  Neurological: Negative for dizziness, numbness and headaches.  Psychiatric/Behavioral:       Denies concerns for anxiety or depression       Past Medical History:  Diagnosis Date  . Asthma   . Chicken pox   . Diabetes mellitus without complication ([place])    boderline  . Hyperlipidemia   . Hypertension      Social History   Social History  . Marital status: Married    Spouse name: N/A  . Number of children: N/A  . Years of education: N/A   Occupational  History  . Not on file.   Social History Main Topics  . Smoking status: Never Smoker  . Smokeless tobacco: Never Used  . Alcohol use 0.0 oz/week     Comment: rarely  . Drug use: Unknown  . Sexual activity: Not on file   Other Topics Concern  . Not on file   Social History Narrative   Married.   No children.   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   Enjoys reading, traveling, movies.    Past Surgical History:  Procedure Laterality Date  . POLYPECTOMY     Uterine    Family History  Problem Relation Age of Onset  . Cancer Mother     breast  . Heart disease Father   . Hypertension Father   . Heart disease Maternal Grandfather   . Hypertension Maternal Grandfather   . Diabetes Maternal Grandfather     No Known Allergies  Current Outpatient Prescriptions on File Prior to Visit  Medication Sig Dispense Refill  . ALPRAZolam (XANAX) 0.25 MG tablet Take 0.25 mg by mouth daily as needed.  1  . amLODipine (NORVASC) 5 MG tablet TAKE 1 TABLET (5 MG TOTAL) BY MOUTH DAILY. 90 tablet 1  . glyBURIDE (DIABETA) 5 MG tablet Take 0.5 tablets (2.5 mg total) by mouth 2 (two) times daily. 180 tablet 3  . losartan-hydrochlorothiazide (HYZAAR) 50-12.5 MG tablet TAKE 2 TABLETS BY ORAL ROUTE ONCE DAILY 180 tablet 1  . simvastatin (ZOCOR) 20 MG tablet TAKE 1 TABLET (20 MG) BY  MOUTH ONCE DAILY IN THE EVENING 90 tablet 0   No current facility-administered medications on file prior to visit.     BP 124/86   Pulse (!) 105   Temp 98 F (36.7 C) (Oral)   Ht 5' (1.524 m)   Wt 251 lb 6.4 oz (114 kg)   LMP 05/07/2016   SpO2 98%   BMI 49.10 kg/m    Objective:   Physical Exam  Constitutional: She is oriented to person, place, and time. She appears well-nourished.  HENT:  Right Ear: Tympanic membrane and ear canal normal.  Left Ear: Tympanic membrane and ear canal normal.  Nose: Nose normal.  Mouth/Throat: Oropharynx is clear and moist.  Eyes: Conjunctivae  and EOM are normal. Pupils are equal, round, and reactive to light.  Neck: Neck supple. No thyromegaly present.  Cardiovascular: Normal rate and regular rhythm.   No murmur heard. Pulmonary/Chest: Effort normal and breath sounds normal. She has no rales.  Abdominal: Soft. Bowel sounds are normal. There is no tenderness.  Musculoskeletal: Normal range of motion.  Lymphadenopathy:    She has no cervical adenopathy.  Neurological: She is alert and oriented to person, place, and time. She has normal reflexes. No cranial nerve deficit.  Skin: Skin is warm and dry. No rash noted.  Psychiatric: She has a normal mood and affect.          Assessment & Plan:

## 2016-07-04 ENCOUNTER — Other Ambulatory Visit: Payer: Self-pay | Admitting: Primary Care

## 2016-07-04 DIAGNOSIS — I10 Essential (primary) hypertension: Secondary | ICD-10-CM

## 2016-07-19 ENCOUNTER — Other Ambulatory Visit: Payer: Self-pay | Admitting: Primary Care

## 2016-07-19 DIAGNOSIS — R7303 Prediabetes: Secondary | ICD-10-CM

## 2016-10-15 ENCOUNTER — Other Ambulatory Visit: Payer: Self-pay | Admitting: Primary Care

## 2016-10-15 DIAGNOSIS — I10 Essential (primary) hypertension: Secondary | ICD-10-CM

## 2016-10-16 ENCOUNTER — Other Ambulatory Visit: Payer: Self-pay | Admitting: Primary Care

## 2016-11-12 ENCOUNTER — Other Ambulatory Visit: Payer: Self-pay | Admitting: *Deleted

## 2016-11-12 DIAGNOSIS — E119 Type 2 diabetes mellitus without complications: Secondary | ICD-10-CM

## 2016-11-18 ENCOUNTER — Other Ambulatory Visit (INDEPENDENT_AMBULATORY_CARE_PROVIDER_SITE_OTHER): Payer: BC Managed Care – PPO

## 2016-11-18 DIAGNOSIS — E119 Type 2 diabetes mellitus without complications: Secondary | ICD-10-CM

## 2016-11-18 LAB — HEMOGLOBIN A1C: HEMOGLOBIN A1C: 6.1 % (ref 4.6–6.5)

## 2016-12-02 ENCOUNTER — Ambulatory Visit (INDEPENDENT_AMBULATORY_CARE_PROVIDER_SITE_OTHER): Payer: BC Managed Care – PPO | Admitting: Primary Care

## 2016-12-02 ENCOUNTER — Encounter: Payer: Self-pay | Admitting: Primary Care

## 2016-12-02 VITALS — BP 140/82 | HR 111 | Temp 98.3°F | Ht 60.0 in | Wt 254.8 lb

## 2016-12-02 DIAGNOSIS — R1031 Right lower quadrant pain: Secondary | ICD-10-CM

## 2016-12-02 LAB — COMPREHENSIVE METABOLIC PANEL
ALT: 22 U/L (ref 0–35)
AST: 19 U/L (ref 0–37)
Albumin: 4.3 g/dL (ref 3.5–5.2)
Alkaline Phosphatase: 38 U/L — ABNORMAL LOW (ref 39–117)
BILIRUBIN TOTAL: 0.4 mg/dL (ref 0.2–1.2)
BUN: 12 mg/dL (ref 6–23)
CALCIUM: 9.4 mg/dL (ref 8.4–10.5)
CHLORIDE: 102 meq/L (ref 96–112)
CO2: 29 meq/L (ref 19–32)
Creatinine, Ser: 0.74 mg/dL (ref 0.40–1.20)
GFR: 90.56 mL/min (ref >60.00)
Glucose, Bld: 120 mg/dL — ABNORMAL HIGH (ref 70–99)
POTASSIUM: 4 meq/L (ref 3.5–5.1)
Sodium: 139 mEq/L (ref 135–145)
Total Protein: 7.4 g/dL (ref 6.0–8.3)

## 2016-12-02 LAB — CBC WITH DIFFERENTIAL/PLATELET
BASOS PCT: 0.6 % (ref 0.0–3.0)
Basophils Absolute: 0.1 10*3/uL (ref 0.0–0.1)
Eosinophils Absolute: 0.3 10*3/uL (ref 0.0–0.7)
Eosinophils Relative: 2.6 % (ref 0.0–5.0)
HEMATOCRIT: 42 % (ref 36.0–46.0)
Hemoglobin: 13.7 g/dL (ref 12.0–15.0)
LYMPHS PCT: 23.3 % (ref 12.0–46.0)
Lymphs Abs: 2.4 10*3/uL (ref 0.7–4.0)
MCHC: 32.5 g/dL (ref 30.0–36.0)
MCV: 82.6 fl (ref 78.0–100.0)
MONOS PCT: 4.5 % (ref 3.0–12.0)
Monocytes Absolute: 0.5 10*3/uL (ref 0.1–1.0)
NEUTROS ABS: 7.2 10*3/uL (ref 1.4–7.7)
Neutrophils Relative %: 69 % (ref 43.0–77.0)
Platelets: 334 10*3/uL (ref 150.0–400.0)
RBC: 5.09 Mil/uL (ref 3.87–5.11)
RDW: 14.9 % (ref 11.5–15.5)
WBC: 10.4 10*3/uL (ref 4.0–10.5)

## 2016-12-02 LAB — POC URINALSYSI DIPSTICK (AUTOMATED)
Bilirubin, UA: NEGATIVE
Glucose, UA: NEGATIVE
KETONES UA: NEGATIVE
Leukocytes, UA: NEGATIVE
Nitrite, UA: NEGATIVE
PH UA: 6 (ref 5.0–8.0)
RBC UA: NEGATIVE
SPEC GRAV UA: 1.025 (ref 1.010–1.025)
Urobilinogen, UA: NEGATIVE E.U./dL — AB

## 2016-12-02 NOTE — Patient Instructions (Signed)
Complete lab work prior to leaving today. I will notify you of your results once received.   Please call me if you start running fevers, experience nausea/vomiting/diarrhea/bloody stools.  It was a pleasure to see you today!

## 2016-12-02 NOTE — Addendum Note (Signed)
Addended by: Jacqualin Combes on: 12/02/2016 08:32 AM   Modules accepted: Orders

## 2016-12-02 NOTE — Progress Notes (Signed)
Subjective:    Patient ID: Destiny West, female    DOB: Sep 24, 1970, 46 y.o.   MRN: [id number]  HPI  Destiny West is a 46 year old female with a history of prediabetes, hypertension, hyperlipidemia who presents today with a chief complaint of groin pain. Her pain began five days ago and is mostly located to the right groin. She felt as though her "ovaries were swollen" on Saturday when the pain began. Pain is intermittent, improved with sitting. She's also noticed the pain to her bilateral lateral sides, between her shoulder blades, and lower back. She did experience urgency with difficulty urinating on Saturday, no problems since.   She denies vaginal discharge, itching, injury/trauma to the lower back, constipation, diarrhea, fevers. Overall her discomfort is better today.   Review of Systems  Constitutional: Negative for fever.  Cardiovascular: Negative for chest pain.  Gastrointestinal: Negative for blood in stool, constipation and diarrhea.  Genitourinary: Negative for difficulty urinating, dysuria, flank pain, genital sores, urgency and vaginal discharge.       Past Medical History:  Diagnosis Date  . Asthma   . Chicken pox   . Diabetes mellitus without complication ([place])    boderline  . Hyperlipidemia   . Hypertension      Social History   Social History  . Marital status: Married    Spouse name: N/A  . Number of children: N/A  . Years of education: N/A   Occupational History  . Not on file.   Social History Main Topics  . Smoking status: Never Smoker  . Smokeless tobacco: Never Used  . Alcohol use 0.0 oz/week     Comment: rarely  . Drug use: Unknown  . Sexual activity: Not on file   Other Topics Concern  . Not on file   Social History Narrative   Married.   No children.   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   Enjoys reading, traveling, movies.    Past Surgical History:  Procedure Laterality Date  . POLYPECTOMY     Uterine      Family History  Problem Relation Age of Onset  . Cancer Mother        breast  . Heart disease Father   . Hypertension Father   . Heart disease Maternal Grandfather   . Hypertension Maternal Grandfather   . Diabetes Maternal Grandfather     No Known Allergies  Current Outpatient Prescriptions on File Prior to Visit  Medication Sig Dispense Refill  . albuterol (PROAIR HFA) 108 (90 Base) MCG/ACT inhaler Inhale 2 puffs into the lungs every 6 (six) hours as needed for wheezing or shortness of breath. 1 Inhaler 3  . amLODipine (NORVASC) 5 MG tablet TAKE 1 TABLET (5 MG TOTAL) BY MOUTH DAILY. 90 tablet 1  . glyBURIDE (DIABETA) 5 MG tablet TAKE 0.5 TABLETS (2.5 MG TOTAL) BY MOUTH 2 (TWO) TIMES DAILY. 180 tablet 1  . losartan-hydrochlorothiazide (HYZAAR) 50-12.5 MG tablet TAKE 2 TABLETS BY ORAL ROUTE ONCE DAILY 180 tablet 1  . simvastatin (ZOCOR) 20 MG tablet TAKE 1 TABLET (20 MG) BY MOUTH ONCE DAILY IN THE EVENING 90 tablet 1  . ALPRAZolam (XANAX) 0.25 MG tablet Take 0.25 mg by mouth daily as needed.  1   No current facility-administered medications on file prior to visit.     BP 140/82   Pulse (!) 111   Temp 98.3 F (36.8 C) (Oral)   Ht 5' (1.524 m)  Wt 254 lb 12.8 oz (115.6 kg)   LMP 11/04/2016   SpO2 98%   BMI 49.76 kg/m    Objective:   Physical Exam  Constitutional: She appears well-nourished. She does not appear ill.  Neck: Neck supple.  Cardiovascular: Normal rate.   Pulmonary/Chest: Effort normal.  Abdominal: Soft. Normal appearance and bowel sounds are normal. There is no tenderness. There is no guarding, no CVA tenderness, no tenderness at McBurney's point and negative Murphy's sign.  Skin: Skin is warm and dry.          Assessment & Plan:  Groin Pain:  Located to right groin mostly, also with some lower back and lateral side pain. Exam today unremarkable. Does not appear acutely ill. Low suspicion for appendicitis given location of pain. Suspect more  ovarian involvement vs abdominal. UA today: Negative for leuks, nitrites, blood. Check CBC and CMP today. Recommend Pelvic and Transvaginal ultrasound, she would like to wait as she's already feeling better. Discussed to notify me if symptoms persist. If CBC is suspicious, will proceed with imaging.   Sheral Flow, NP

## 2016-12-26 ENCOUNTER — Other Ambulatory Visit: Payer: Self-pay | Admitting: Primary Care

## 2016-12-26 DIAGNOSIS — I10 Essential (primary) hypertension: Secondary | ICD-10-CM

## 2017-04-10 ENCOUNTER — Other Ambulatory Visit: Payer: Self-pay | Admitting: Primary Care

## 2017-04-10 DIAGNOSIS — I10 Essential (primary) hypertension: Secondary | ICD-10-CM

## 2017-05-30 ENCOUNTER — Other Ambulatory Visit: Payer: Self-pay | Admitting: Primary Care

## 2017-05-30 DIAGNOSIS — R7303 Prediabetes: Secondary | ICD-10-CM

## 2017-05-30 DIAGNOSIS — E785 Hyperlipidemia, unspecified: Secondary | ICD-10-CM

## 2017-05-31 ENCOUNTER — Other Ambulatory Visit (INDEPENDENT_AMBULATORY_CARE_PROVIDER_SITE_OTHER): Payer: BC Managed Care – PPO

## 2017-05-31 DIAGNOSIS — E785 Hyperlipidemia, unspecified: Secondary | ICD-10-CM | POA: Diagnosis not present

## 2017-05-31 DIAGNOSIS — R7303 Prediabetes: Secondary | ICD-10-CM | POA: Diagnosis not present

## 2017-05-31 LAB — LIPID PANEL
CHOL/HDL RATIO: 4
Cholesterol: 192 mg/dL (ref 0–200)
HDL: 45.7 mg/dL (ref >39.00)
LDL CALC: 114 mg/dL — AB (ref 0–99)
NonHDL: 146.24
Triglycerides: 163 mg/dL — ABNORMAL HIGH (ref 0.0–149.0)
VLDL: 32.6 mg/dL (ref 0.0–40.0)

## 2017-05-31 LAB — COMPREHENSIVE METABOLIC PANEL
ALT: 21 U/L (ref 0–35)
AST: 20 U/L (ref 0–37)
Albumin: 4.3 g/dL (ref 3.5–5.2)
Alkaline Phosphatase: 33 U/L — ABNORMAL LOW (ref 39–117)
BUN: 13 mg/dL (ref 6–23)
CHLORIDE: 104 meq/L (ref 96–112)
CO2: 28 meq/L (ref 19–32)
CREATININE: 0.72 mg/dL (ref 0.40–1.20)
Calcium: 9.1 mg/dL (ref 8.4–10.5)
GFR: 93.26 mL/min (ref >60.00)
GLUCOSE: 125 mg/dL — AB (ref 70–99)
Potassium: 3.9 mEq/L (ref 3.5–5.1)
SODIUM: 139 meq/L (ref 135–145)
Total Bilirubin: 0.5 mg/dL (ref 0.2–1.2)
Total Protein: 7.3 g/dL (ref 6.0–8.3)

## 2017-05-31 LAB — HEMOGLOBIN A1C: Hgb A1c MFr Bld: 6.4 % (ref 4.6–6.5)

## 2017-06-03 ENCOUNTER — Ambulatory Visit (INDEPENDENT_AMBULATORY_CARE_PROVIDER_SITE_OTHER): Payer: BC Managed Care – PPO | Admitting: Primary Care

## 2017-06-03 ENCOUNTER — Encounter: Payer: Self-pay | Admitting: Primary Care

## 2017-06-03 VITALS — BP 122/76 | HR 106 | Temp 98.5°F | Ht 60.0 in | Wt 266.5 lb

## 2017-06-03 DIAGNOSIS — Z6841 Body Mass Index (BMI) 40.0 and over, adult: Secondary | ICD-10-CM | POA: Insufficient documentation

## 2017-06-03 DIAGNOSIS — E785 Hyperlipidemia, unspecified: Secondary | ICD-10-CM

## 2017-06-03 DIAGNOSIS — I10 Essential (primary) hypertension: Secondary | ICD-10-CM | POA: Diagnosis not present

## 2017-06-03 DIAGNOSIS — Z0000 Encounter for general adult medical examination without abnormal findings: Secondary | ICD-10-CM

## 2017-06-03 DIAGNOSIS — Z0001 Encounter for general adult medical examination with abnormal findings: Secondary | ICD-10-CM

## 2017-06-03 DIAGNOSIS — R7303 Prediabetes: Secondary | ICD-10-CM

## 2017-06-03 DIAGNOSIS — Z1239 Encounter for other screening for malignant neoplasm of breast: Secondary | ICD-10-CM

## 2017-06-03 MED ORDER — METFORMIN HCL 500 MG PO TABS: 500.0000 mg | ORAL_TABLET | Freq: Two times a day (BID) | ORAL | 1 refills | Status: DC

## 2017-06-03 NOTE — Assessment & Plan Note (Signed)
Overall stable, will allow her to work on weight loss through healthy diet and regular exercise.  Continue simvastatin 20 mg for now.

## 2017-06-03 NOTE — Patient Instructions (Signed)
Stop glyburide medication.  Start metformin 500 mg tablets. Take 1 tablet by mouth once daily for 2 weeks, then twice daily thereafter.   Call the [facility] to schedule your mammogram.  Start exercising. You should be getting 150 minutes of moderate intensity exercise weekly.  Ensure you are consuming 64 ounces of water daily.  Restart weight watchers or start calorie counting. Download the App My Fitness Pal.   Schedule a 30 minute follow up visit with me in 3 months for follow up of weight loss.   It was a pleasure to see you today!   Preventive Care 40-64 Years, Female Preventive care refers to lifestyle choices and visits with your health care provider that can promote health and wellness. What does preventive care include?  A yearly physical exam. This is also called an annual well check.  Dental exams once or twice a year.  Routine eye exams. Ask your health care provider how often you should have your eyes checked.  Personal lifestyle choices, including: ? Daily care of your teeth and gums. ? Regular physical activity. ? Eating a healthy diet. ? Avoiding tobacco and drug use. ? Limiting alcohol use. ? Practicing safe sex. ? Taking low-dose aspirin daily starting at age 82. ? Taking vitamin and mineral supplements as recommended by your health care provider. What happens during an annual well check? The services and screenings done by your health care provider during your annual well check will depend on your age, overall health, lifestyle risk factors, and family history of disease. Counseling Your health care provider may ask you questions about your:  Alcohol use.  Tobacco use.  Drug use.  Emotional well-being.  Home and relationship well-being.  Sexual activity.  Eating habits.  Work and work Statistician.  Method of birth control.  Menstrual cycle.  Pregnancy history.  Screening You may have the following tests or  measurements:  Height, weight, and BMI.  Blood pressure.  Lipid and cholesterol levels. These may be checked every 5 years, or more frequently if you are over 83 years old.  Skin check.  Lung cancer screening. You may have this screening every year starting at age 31 if you have a 30-pack-year history of smoking and currently smoke or have quit within the past 15 years.  Fecal occult blood test (FOBT) of the stool. You may have this test every year starting at age 5.  Flexible sigmoidoscopy or colonoscopy. You may have a sigmoidoscopy every 5 years or a colonoscopy every 10 years starting at age 30.  Hepatitis C blood test.  Hepatitis B blood test.  Sexually transmitted disease (STD) testing.  Diabetes screening. This is done by checking your blood sugar (glucose) after you have not eaten for a while (fasting). You may have this done every 1-3 years.  Mammogram. This may be done every 1-2 years. Talk to your health care provider about when you should start having regular mammograms. This may depend on whether you have a family history of breast cancer.  BRCA-related cancer screening. This may be done if you have a family history of breast, ovarian, tubal, or peritoneal cancers.  Pelvic exam and Pap test. This may be done every 3 years starting at age 70. Starting at age 84, this may be done every 5 years if you have a Pap test in combination with an HPV test.  Bone density scan. This is done to screen for osteoporosis. You may have this scan if you are at high  risk for osteoporosis.  Discuss your test results, treatment options, and if necessary, the need for more tests with your health care provider. Vaccines Your health care provider may recommend certain vaccines, such as:  Influenza vaccine. This is recommended every year.  Tetanus, diphtheria, and acellular pertussis (Tdap, Td) vaccine. You may need a Td booster every 10 years.  Varicella vaccine. You may need this if  you have not been vaccinated.  Zoster vaccine. You may need this after age 63.  Measles, mumps, and rubella (MMR) vaccine. You may need at least one dose of MMR if you were born in 1957 or later. You may also need a second dose.  Pneumococcal 13-valent conjugate (PCV13) vaccine. You may need this if you have certain conditions and were not previously vaccinated.  Pneumococcal polysaccharide (PPSV23) vaccine. You may need one or two doses if you smoke cigarettes or if you have certain conditions.  Meningococcal vaccine. You may need this if you have certain conditions.  Hepatitis A vaccine. You may need this if you have certain conditions or if you travel or work in places where you may be exposed to hepatitis A.  Hepatitis B vaccine. You may need this if you have certain conditions or if you travel or work in places where you may be exposed to hepatitis B.  Haemophilus influenzae type b (Hib) vaccine. You may need this if you have certain conditions.  Talk to your health care provider about which screenings and vaccines you need and how often you need them. This information is not intended to replace advice given to you by your health care provider. Make sure you discuss any questions you have with your health care provider. Document Released: 05/16/2015 Document Revised: 01/07/2016 Document Reviewed: 02/18/2015 Elsevier Interactive Patient Education  [employer].

## 2017-06-03 NOTE — Assessment & Plan Note (Signed)
Tetanus up-to-date, declines influenza vaccination. Pap smear due, she will follow-up with her GYN. Mammogram ordered, pending.  Long discussion today regarding weight loss and importance of healthy diet with regular exercise.  We went through her diet in great detail, recommended she restart weight watchers as she was quite successful on this in the past.  Also discussed calorie counting by downloading apps to her phone.  Also offered referral to bariatric clinic for information on bariatric surgery.  Also discussed weight loss clinic in [place] for which she currently declines.  Exam unremarkable.  Labs addressed. Follow up in 1 year.

## 2017-06-03 NOTE — Assessment & Plan Note (Signed)
Stable in the office today.  Continue amlodipine and Hyzaar.

## 2017-06-03 NOTE — Assessment & Plan Note (Signed)
Long discussion today regarding weight loss and importance of healthy diet with regular exercise.  We went through her diet in great detail, recommended she restart weight watchers as she was quite successful on this in the past.  Also discussed calorie counting by downloading apps to her phone.  Also offered referral to bariatric clinic for information on bariatric surgery.  Also discussed weight loss clinic in [place] for which she currently declines.  Follow-up in 3 months for weight check.

## 2017-06-03 NOTE — Assessment & Plan Note (Signed)
Increase of A1c from 6.0 to 6.4 on recent labs.  Discontinue glyburide, switch to metformin 500 mg tablets twice daily.   Long discussion today regarding weight loss and importance of healthy diet with regular exercise.  We went through her diet in great detail, recommended she restart weight watchers as she was quite successful on this in the past.  Also discussed calorie counting by downloading apps to her phone.  Also offered referral to bariatric clinic for information on bariatric surgery.  Also discussed weight loss clinic in [place] for which she currently declines.  Repeat A1c in 3 months.

## 2017-06-03 NOTE — Progress Notes (Signed)
Subjective:    Patient ID: Destiny West, female    DOB: 02-01-1971, 47 y.o.   MRN: [id number]  HPI  Destiny West is a 47 year old female who presents today for complete physical.  She also like to discuss weight loss.  Immunizations: -Tetanus: Completed in 2017 -Influenza: Declines    Diet: She endorses a poor diet. She has never calorie counted. She's done weight watchers in the past with weight loss but then lost motivation.  Breakfast: Skips  Lunch: Salad with vegetables, balsamic vinegar  Dinner: Chicken (grilled), salmon, shrimp, pork, vegetables, couscous, quinoa Snacks: Occasionally, veggies with humus, pretzels, chips Desserts: Occasionally, 2 times weekly (oreos) Beverages: Water, seltzer water, 2-3 diet sodas weekly  Wt Readings from Last 3 Encounters:  06/03/17 266 lb 8 oz (120.9 kg)  12/02/16 254 lb 12.8 oz (115.6 kg)  05/21/16 251 lb 6.4 oz (114 kg)     Exercise: She is not currently exercising because she's afraid to hurt her back. She had a 6 month bout of back pain five years ago which was miserable. No injury.  Eye exam: Completed in May 2018 Dental exam: No recent visit. Pap Smear: Completed 3 years ago Mammogram: Due. Ordered.   Review of Systems  Constitutional: Negative for unexpected weight change.  HENT: Negative for rhinorrhea.   Respiratory: Negative for cough and shortness of breath.   Cardiovascular: Negative for chest pain.  Gastrointestinal: Negative for constipation and diarrhea.  Genitourinary: Negative for difficulty urinating and menstrual problem.  Musculoskeletal: Negative for arthralgias and myalgias.  Skin: Negative for rash.  Allergic/Immunologic: Negative for environmental allergies.  Neurological: Negative for dizziness, numbness and headaches.  Psychiatric/Behavioral: The patient is not nervous/anxious.        Past Medical History:  Diagnosis Date  . Asthma   . Chicken pox   . Diabetes mellitus without complication ([place])    boderline  . Hyperlipidemia   . Hypertension      Social History   Socioeconomic History  . Marital status: Married    Spouse name: Not on file  . Number of children: Not on file  . Years of education: Not on file  . Highest education level: Not on file  Social Needs  . Financial resource strain: Not on file  . Food insecurity - worry: Not on file  . Food insecurity - inability: Not on file  . Transportation needs - medical: Not on file  . Transportation needs - non-medical: Not on file  Occupational History  . Not on file  Tobacco Use  . Smoking status: Never Smoker  . Smokeless tobacco: Never Used  Substance and Sexual Activity  . Alcohol use: Yes    Alcohol/week: 0.0 oz    Comment: rarely  . Drug use: Not on file  . Sexual activity: Not on file  Other Topics Concern  . Not on file  Social History Narrative   Married.   No children.   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   Enjoys reading, traveling, movies.    Past Surgical History:  Procedure Laterality Date  . POLYPECTOMY     Uterine    Family History  Problem Relation Age of Onset  . Cancer Mother        breast  . Heart disease Father   . Hypertension Father   . Heart disease Maternal Grandfather   . Hypertension Maternal Grandfather   . Diabetes Maternal Grandfather  No Known Allergies  Current Outpatient Medications on File Prior to Visit  Medication Sig Dispense Refill  . albuterol (PROAIR HFA) 108 (90 Base) MCG/ACT inhaler Inhale 2 puffs into the lungs every 6 (six) hours as needed for wheezing or shortness of breath. 1 Inhaler 3  . amLODipine (NORVASC) 5 MG tablet TAKE 1 TABLET (5 MG TOTAL) BY MOUTH DAILY. 90 tablet 1  . losartan-hydrochlorothiazide (HYZAAR) 50-12.5 MG tablet TAKE 2 TABLETS BY ORAL ROUTE ONCE DAILY 180 tablet 0  . simvastatin (ZOCOR) 20 MG tablet Take 1 tablet (20 mg total) by mouth every evening. NEED OFFICE VISIT FOR ANY MORE REFILLS 90 tablet  0  . ALPRAZolam (XANAX) 0.25 MG tablet Take 0.25 mg by mouth daily as needed.  1   No current facility-administered medications on file prior to visit.     BP 122/76   Pulse (!) 106   Temp 98.5 F (36.9 C) (Oral)   Ht 5' (1.524 m)   Wt 266 lb 8 oz (120.9 kg)   LMP 04/18/2017   SpO2 98%   BMI 52.05 kg/m    Objective:   Physical Exam  Constitutional: She is oriented to person, place, and time. She appears well-nourished.  HENT:  Right Ear: Tympanic membrane and ear canal normal.  Left Ear: Tympanic membrane and ear canal normal.  Nose: Nose normal.  Mouth/Throat: Oropharynx is clear and moist.  Eyes: Conjunctivae and EOM are normal. Pupils are equal, round, and reactive to light.  Neck: Neck supple. No thyromegaly present.  Cardiovascular: Normal rate and regular rhythm.  No murmur heard. Pulmonary/Chest: Effort normal and breath sounds normal. She has no rales.  Abdominal: Soft. Bowel sounds are normal. There is no tenderness.  Musculoskeletal: Normal range of motion.  Lymphadenopathy:    She has no cervical adenopathy.  Neurological: She is alert and oriented to person, place, and time. She has normal reflexes. No cranial nerve deficit.  Skin: Skin is warm and dry. No rash noted.  Psychiatric: She has a normal mood and affect.          Assessment & Plan:

## 2017-06-25 ENCOUNTER — Other Ambulatory Visit: Payer: Self-pay | Admitting: Primary Care

## 2017-06-25 DIAGNOSIS — I10 Essential (primary) hypertension: Secondary | ICD-10-CM

## 2017-07-09 ENCOUNTER — Other Ambulatory Visit: Payer: Self-pay | Admitting: Primary Care

## 2017-07-12 ENCOUNTER — Other Ambulatory Visit: Payer: Self-pay | Admitting: Primary Care

## 2017-07-12 DIAGNOSIS — I10 Essential (primary) hypertension: Secondary | ICD-10-CM

## 2017-08-18 ENCOUNTER — Ambulatory Visit
Admission: RE | Admit: 2017-08-18 | Discharge: 2017-08-18 | Disposition: A | Discharge status: Home / Self Care | Payer: BC Managed Care – PPO | Source: Ambulatory Visit | Attending: Primary Care | Admitting: Primary Care

## 2017-08-18 DIAGNOSIS — R928 Other abnormal and inconclusive findings on diagnostic imaging of breast: Secondary | ICD-10-CM | POA: Insufficient documentation

## 2017-08-18 DIAGNOSIS — Z1239 Encounter for other screening for malignant neoplasm of breast: Secondary | ICD-10-CM

## 2017-08-18 DIAGNOSIS — Z1231 Encounter for screening mammogram for malignant neoplasm of breast: Secondary | ICD-10-CM | POA: Insufficient documentation

## 2017-08-25 ENCOUNTER — Other Ambulatory Visit: Payer: Self-pay | Admitting: *Deleted

## 2017-08-25 ENCOUNTER — Inpatient Hospital Stay
Admission: RE | Admit: 2017-08-25 | Discharge: 2017-08-25 | Disposition: A | Discharge status: Home / Self Care | Payer: Self-pay | Source: Ambulatory Visit | Attending: *Deleted | Admitting: *Deleted

## 2017-08-25 DIAGNOSIS — Z9289 Personal history of other medical treatment: Secondary | ICD-10-CM

## 2017-08-29 ENCOUNTER — Other Ambulatory Visit: Payer: Self-pay | Admitting: Primary Care

## 2017-08-29 DIAGNOSIS — N6320 Unspecified lump in the left breast, unspecified quadrant: Secondary | ICD-10-CM

## 2017-08-29 DIAGNOSIS — R928 Other abnormal and inconclusive findings on diagnostic imaging of breast: Secondary | ICD-10-CM

## 2017-08-31 ENCOUNTER — Ambulatory Visit: Payer: BC Managed Care – PPO | Admitting: Primary Care

## 2017-08-31 ENCOUNTER — Encounter: Payer: Self-pay | Admitting: Primary Care

## 2017-08-31 VITALS — BP 124/76 | HR 102 | Temp 98.5°F | Ht 60.0 in | Wt 257.0 lb

## 2017-08-31 DIAGNOSIS — R7303 Prediabetes: Secondary | ICD-10-CM

## 2017-08-31 LAB — POCT GLYCOSYLATED HEMOGLOBIN (HGB A1C): Hemoglobin A1C: 6.7

## 2017-08-31 NOTE — Progress Notes (Signed)
Subjective:    Patient ID: Destiny West, female    DOB: [birth year], 47 y.o.   MRN: [id number]  HPI  Destiny West is a 47 year old female with a history of prediabetes, hypertension, morbid obesity who presents today for follow up.  1) Morbid Obesity: During her last visit we discussed the importance of improving her diet and spent a great deal of time going through her current diet. We discussed consideration of Weight Watchers, calorie counting, bariatric surgery, or the weight loss center in [place].   Since her last visit she's lost 9 pounds.   Diet currently consists of:  Breakfast: Hard boiled eggs Lunch: Bag salad, occasional fat free cheese Dinner: Chicken, salmon, [place], shrimp, vegetables Snacks: Chips (limited use), roasted nuts, yogurt Desserts: Occasional candy Beverages: Water, flavored Seltzer water, diet soda  Exercise: She is not exercising.    Wt Readings from Last 3 Encounters:  08/31/17 257 lb (116.6 kg)  06/03/17 266 lb 8 oz (120.9 kg)  12/02/16 254 lb 12.8 oz (115.6 kg)     2) Prediabetes: A1C of 6.4 in early February 2019, increased from 6.1 in July 2018. We spent a great deal discussing prevention in the form of weight loss through healthy diet and regular exercise. We initiated Metformin 500 mg BID. She is due for repeat A1C today.  Since her last visit she's had visual changes. She saw a retinal specialist and was diagnosed with "MEWS" eye condition that should improve on it's own.   Review of Systems  Eyes:       See HPI  Respiratory: Negative for shortness of breath.   Cardiovascular: Negative for chest pain.  Neurological: Negative for dizziness, numbness and headaches.       Past Medical History:  Diagnosis Date  . Asthma   . Chicken pox   . Diabetes mellitus without complication ([place])    boderline  . Hyperlipidemia   . Hypertension      Social History   Socioeconomic History  . Marital status: Married    Spouse name: Not on file   . Number of children: Not on file  . Years of education: Not on file  . Highest education level: Not on file  Occupational History  . Not on file  Social Needs  . Financial resource strain: Not on file  . Food insecurity:    Worry: Not on file    Inability: Not on file  . Transportation needs:    Medical: Not on file    Non-medical: Not on file  Tobacco Use  . Smoking status: Never Smoker  . Smokeless tobacco: Never Used  Substance and Sexual Activity  . Alcohol use: Yes    Alcohol/week: 0.0 oz    Comment: rarely  . Drug use: Not on file  . Sexual activity: Not on file  Lifestyle  . Physical activity:    Days per week: Not on file    Minutes per session: Not on file  . Stress: Not on file  Relationships  . Social connections:    Talks on phone: Not on file    Gets together: Not on file    Attends religious service: Not on file    Active member of club or organization: Not on file    Attends meetings of clubs or organizations: Not on file    Relationship status: Not on file  . Intimate partner violence:    Fear of current or ex partner: Not on file  Emotionally abused: Not on file    Physically abused: Not on file    Forced sexual activity: Not on file  Other Topics Concern  . Not on file  Social History Narrative   Married.   No children.   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   Enjoys reading, traveling, movies.    Past Surgical History:  Procedure Laterality Date  . POLYPECTOMY     Uterine    Family History  Problem Relation Age of Onset  . Cancer Mother        breast  . Breast cancer Mother 30  . Heart disease Father   . Hypertension Father   . Heart disease Maternal Grandfather   . Hypertension Maternal Grandfather   . Diabetes Maternal Grandfather     No Known Allergies  Current Outpatient Medications on File Prior to Visit  Medication Sig Dispense Refill  . albuterol (PROAIR HFA) 108 (90 Base) MCG/ACT  inhaler Inhale 2 puffs into the lungs every 6 (six) hours as needed for wheezing or shortness of breath. 1 Inhaler 3  . ALPRAZolam (XANAX) 0.25 MG tablet Take 0.25 mg by mouth daily as needed.  1  . amLODipine (NORVASC) 5 MG tablet TAKE 1 TABLET (5 MG TOTAL) BY MOUTH DAILY. 90 tablet 1  . losartan-hydrochlorothiazide (HYZAAR) 50-12.5 MG tablet TAKE 2 TABLETS BY ORAL ROUTE ONCE DAILY 180 tablet 1  . metFORMIN (GLUCOPHAGE) 500 MG tablet Take 1 tablet (500 mg total) by mouth 2 (two) times daily with a meal. 180 tablet 1  . simvastatin (ZOCOR) 20 MG tablet Take 1 tablet (20 mg total) by mouth every evening. 90 tablet 1   No current facility-administered medications on file prior to visit.     BP 124/76   Pulse (!) 102   Temp 98.5 F (36.9 C) (Oral)   Ht 5' (1.524 m)   Wt 257 lb (116.6 kg)   LMP 08/21/2017   SpO2 99%   BMI 50.19 kg/m    Objective:   Physical Exam  Constitutional: She appears well-nourished.  Cardiovascular: Normal rate and regular rhythm.  Pulmonary/Chest: Effort normal and breath sounds normal.  Abdominal: Soft.  Skin: Skin is warm and dry.  Psychiatric: She has a normal mood and affect.          Assessment & Plan:

## 2017-08-31 NOTE — Assessment & Plan Note (Signed)
Doing well on Metformin, denies side effects. Repeat A1C pending today. Commended her on her 9 pound weight loss!

## 2017-08-31 NOTE — Assessment & Plan Note (Signed)
9 pound weight loss since last visit, commended her on this! Recommended she slowly start exercising.  Diet is overall great.  Continue Metformin. Follow up in 3 months for re-evaluation.

## 2017-08-31 NOTE — Progress Notes (Signed)
Subjective:    Patient ID: Destiny West, female    DOB: 12/22/1970, 47 y.o.   MRN: [id number]  HPI Destiny West is a 47 y.o. female who presents today for a follow-up on her Borderline diabetes. She was last seen by Allie Bossier, AGNP in [place] and received extensive diet and exercise education. She was also switched from Glyburide to Metformin. She declined bariatric clinic and weight loss clinic referral which she declined.  Reports medication adherence and no complications. Did have some vision changes but saw an Ophthalmologist, issue resolving and has a follow-up in September. Denies any headache or GI upset. Has not been using any calorie counting trackers.  Has not increased in exercise due to back problems that is likely secondary to her obesity.   Typical Diet: Poor - Breakfast: 2 Hard boiled eggs without salt - Lunch: Small bag salad with some tomatoes and balsamic dressing and occassional fat free Feta Cheese - Dinner: Chicken (grilled), Salmon, [place], Shrimp, Zucchini, low-sodium canned veggies. No starches or rarely pasta - Sweets: Handful MnMs occassionally  - Snacks: Chips, Roasted Pistachios or Almonds, Greek Yogurt - Drinks: Water, flavored seltzer, Diet soda with dinner. Rarely coffee and unsweetened tea. No juice.  Review of Systems  Constitutional: Negative for fatigue and fever.  Respiratory: Negative for cough, chest tightness and shortness of breath.   Cardiovascular: Negative for chest pain and palpitations.  Gastrointestinal: Negative for abdominal pain, diarrhea, nausea and vomiting.  Endocrine: Negative for polydipsia, polyphagia and polyuria.  Genitourinary: Negative for difficulty urinating, dysuria and frequency.  Musculoskeletal: Negative for myalgias.      Past Medical History:  Diagnosis Date  . Asthma   . Chicken pox   . Diabetes mellitus without complication ([place])    boderline  . Hyperlipidemia   . Hypertension    Past Surgical History:    Procedure Laterality Date  . POLYPECTOMY     Uterine   Social History   Socioeconomic History  . Marital status: Married    Spouse name: Not on file  . Number of children: Not on file  . Years of education: Not on file  . Highest education level: Not on file  Occupational History  . Not on file  Social Needs  . Financial resource strain: Not on file  . Food insecurity:    Worry: Not on file    Inability: Not on file  . Transportation needs:    Medical: Not on file    Non-medical: Not on file  Tobacco Use  . Smoking status: Never Smoker  . Smokeless tobacco: Never Used  Substance and Sexual Activity  . Alcohol use: Yes    Alcohol/week: 0.0 oz    Comment: rarely  . Drug use: Not on file  . Sexual activity: Not on file  Lifestyle  . Physical activity:    Days per week: Not on file    Minutes per session: Not on file  . Stress: Not on file  Relationships  . Social connections:    Talks on phone: Not on file    Gets together: Not on file    Attends religious service: Not on file    Active member of club or organization: Not on file    Attends meetings of clubs or organizations: Not on file    Relationship status: Not on file  . Intimate partner violence:    Fear of current or ex partner: Not on file    Emotionally abused: Not on file  Physically abused: Not on file    Forced sexual activity: Not on file  Other Topics Concern  . Not on file  Social History Narrative   Married.   No children.   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   Enjoys reading, traveling, movies.   Family History  Problem Relation Age of Onset  . Cancer Mother        breast  . Breast cancer Mother 64  . Heart disease Father   . Hypertension Father   . Heart disease Maternal Grandfather   . Hypertension Maternal Grandfather   . Diabetes Maternal Grandfather    Current Outpatient Medications on File Prior to Visit  Medication Sig Dispense Refill  .  albuterol (PROAIR HFA) 108 (90 Base) MCG/ACT inhaler Inhale 2 puffs into the lungs every 6 (six) hours as needed for wheezing or shortness of breath. 1 Inhaler 3  . ALPRAZolam (XANAX) 0.25 MG tablet Take 0.25 mg by mouth daily as needed.  1  . amLODipine (NORVASC) 5 MG tablet TAKE 1 TABLET (5 MG TOTAL) BY MOUTH DAILY. 90 tablet 1  . losartan-hydrochlorothiazide (HYZAAR) 50-12.5 MG tablet TAKE 2 TABLETS BY ORAL ROUTE ONCE DAILY 180 tablet 1  . metFORMIN (GLUCOPHAGE) 500 MG tablet Take 1 tablet (500 mg total) by mouth 2 (two) times daily with a meal. 180 tablet 1  . simvastatin (ZOCOR) 20 MG tablet Take 1 tablet (20 mg total) by mouth every evening. 90 tablet 1   No current facility-administered medications on file prior to visit.     Objective:   Physical Exam  Constitutional: She appears well-nourished. No distress.  Cardiovascular: Regular rhythm and normal heart sounds. Exam reveals no gallop and no friction rub.  No murmur heard. Pulmonary/Chest: Effort normal and breath sounds normal.  Abdominal: Soft. Bowel sounds are normal.   BP 124/76   Pulse (!) 102   Temp 98.5 F (36.9 C) (Oral)   Ht 5' (1.524 m)   Wt 257 lb (116.6 kg)   LMP 08/21/2017   SpO2 99%   BMI 50.19 kg/m   Wt Readings from Last 3 Encounters:  08/31/17 257 lb (116.6 kg)  06/03/17 266 lb 8 oz (120.9 kg)  12/02/16 254 lb 12.8 oz (115.6 kg)      Assessment & Plan:   1. Borderline diabetes - Continue on healthy diet - Work on exercise - POCT glycosylated hemoglobin (Hb A1C)  Denita Lung, RN, Adult-Geriatric Nurse Practitioner Student

## 2017-08-31 NOTE — Patient Instructions (Addendum)
CONGRATS on your 9lb weight loss. That is completely AMAZING!! Keep up the great work!  Continue on eating a healthy diet with lots of colorful veggies, lean protein, and whole grains. Be sure to drink 64oz of water every day. Start slowly working on exercise with walking and building up endurance. You need 150 minutes of exercise weekly.  Please follow-up with Anda Kraft in 3 Months. It was a pleasure to see you today!

## 2017-09-05 ENCOUNTER — Ambulatory Visit
Admission: RE | Admit: 2017-09-05 | Discharge: 2017-09-05 | Disposition: A | Discharge status: Home / Self Care | Payer: BC Managed Care – PPO | Source: Ambulatory Visit | Attending: Primary Care | Admitting: Primary Care

## 2017-09-05 DIAGNOSIS — N6320 Unspecified lump in the left breast, unspecified quadrant: Secondary | ICD-10-CM

## 2017-09-05 DIAGNOSIS — R928 Other abnormal and inconclusive findings on diagnostic imaging of breast: Secondary | ICD-10-CM

## 2017-11-21 ENCOUNTER — Other Ambulatory Visit: Payer: Self-pay | Admitting: Primary Care

## 2017-11-21 DIAGNOSIS — R7303 Prediabetes: Secondary | ICD-10-CM

## 2017-12-01 ENCOUNTER — Ambulatory Visit: Payer: BC Managed Care – PPO | Admitting: Primary Care

## 2017-12-25 ENCOUNTER — Other Ambulatory Visit: Payer: Self-pay | Admitting: Primary Care

## 2017-12-25 DIAGNOSIS — I10 Essential (primary) hypertension: Secondary | ICD-10-CM

## 2018-01-01 ENCOUNTER — Other Ambulatory Visit: Payer: Self-pay | Admitting: Primary Care

## 2018-02-04 ENCOUNTER — Other Ambulatory Visit: Payer: Self-pay | Admitting: Primary Care

## 2018-02-09 ENCOUNTER — Other Ambulatory Visit: Payer: Self-pay | Admitting: Primary Care

## 2018-02-09 DIAGNOSIS — N6320 Unspecified lump in the left breast, unspecified quadrant: Secondary | ICD-10-CM

## 2018-03-09 ENCOUNTER — Ambulatory Visit
Admission: RE | Admit: 2018-03-09 | Discharge: 2018-03-09 | Disposition: A | Discharge status: Home / Self Care | Payer: BC Managed Care – PPO | Source: Ambulatory Visit | Attending: Primary Care | Admitting: Primary Care

## 2018-03-09 DIAGNOSIS — N6320 Unspecified lump in the left breast, unspecified quadrant: Secondary | ICD-10-CM | POA: Diagnosis present

## 2018-05-16 ENCOUNTER — Other Ambulatory Visit: Payer: Self-pay | Admitting: Primary Care

## 2018-05-16 DIAGNOSIS — R7303 Prediabetes: Secondary | ICD-10-CM

## 2018-05-17 DIAGNOSIS — I10 Essential (primary) hypertension: Secondary | ICD-10-CM

## 2018-05-19 MED ORDER — LOSARTAN POTASSIUM 100 MG PO TABS: 100.0000 mg | ORAL_TABLET | Freq: Every day | ORAL | 0 refills | Status: DC

## 2018-05-23 ENCOUNTER — Other Ambulatory Visit: Payer: Self-pay | Admitting: Primary Care

## 2018-05-23 DIAGNOSIS — I10 Essential (primary) hypertension: Secondary | ICD-10-CM

## 2018-05-23 DIAGNOSIS — E119 Type 2 diabetes mellitus without complications: Secondary | ICD-10-CM

## 2018-05-23 DIAGNOSIS — E785 Hyperlipidemia, unspecified: Secondary | ICD-10-CM

## 2018-05-30 ENCOUNTER — Other Ambulatory Visit (INDEPENDENT_AMBULATORY_CARE_PROVIDER_SITE_OTHER): Payer: BC Managed Care – PPO

## 2018-05-30 DIAGNOSIS — E785 Hyperlipidemia, unspecified: Secondary | ICD-10-CM

## 2018-05-30 DIAGNOSIS — E119 Type 2 diabetes mellitus without complications: Secondary | ICD-10-CM | POA: Diagnosis not present

## 2018-05-30 DIAGNOSIS — I10 Essential (primary) hypertension: Secondary | ICD-10-CM | POA: Diagnosis not present

## 2018-05-30 LAB — COMPREHENSIVE METABOLIC PANEL
ALT: 34 U/L (ref 0–35)
AST: 26 U/L (ref 0–37)
Albumin: 4.5 g/dL (ref 3.5–5.2)
Alkaline Phosphatase: 39 U/L (ref 39–117)
BUN: 15 mg/dL (ref 6–23)
CO2: 29 meq/L (ref 19–32)
CREATININE: 0.78 mg/dL (ref 0.40–1.20)
Calcium: 10.1 mg/dL (ref 8.4–10.5)
Chloride: 100 mEq/L (ref 96–112)
GFR: 79.64 mL/min (ref >60.00)
GLUCOSE: 175 mg/dL — AB (ref 70–99)
Potassium: 4.2 mEq/L (ref 3.5–5.1)
SODIUM: 140 meq/L (ref 135–145)
TOTAL PROTEIN: 7.4 g/dL (ref 6.0–8.3)
Total Bilirubin: 0.4 mg/dL (ref 0.2–1.2)

## 2018-05-30 LAB — HEMOGLOBIN A1C: Hgb A1c MFr Bld: 7.2 % — ABNORMAL HIGH (ref 4.6–6.5)

## 2018-05-30 LAB — LDL CHOLESTEROL, DIRECT: Direct LDL: 104 mg/dL

## 2018-05-30 LAB — LIPID PANEL
CHOL/HDL RATIO: 5
Cholesterol: 185 mg/dL (ref 0–200)
HDL: 37.8 mg/dL — ABNORMAL LOW (ref >39.00)
NonHDL: 147.34
Triglycerides: 315 mg/dL — ABNORMAL HIGH (ref 0.0–149.0)
VLDL: 63 mg/dL — ABNORMAL HIGH (ref 0.0–40.0)

## 2018-06-05 ENCOUNTER — Ambulatory Visit (INDEPENDENT_AMBULATORY_CARE_PROVIDER_SITE_OTHER): Payer: BC Managed Care – PPO | Admitting: Primary Care

## 2018-06-05 ENCOUNTER — Encounter: Payer: Self-pay | Admitting: Primary Care

## 2018-06-05 VITALS — BP 126/82 | HR 101 | Temp 98.1°F | Ht 60.0 in | Wt 252.5 lb

## 2018-06-05 DIAGNOSIS — J4520 Mild intermittent asthma, uncomplicated: Secondary | ICD-10-CM | POA: Diagnosis not present

## 2018-06-05 DIAGNOSIS — F418 Other specified anxiety disorders: Secondary | ICD-10-CM | POA: Diagnosis not present

## 2018-06-05 DIAGNOSIS — E119 Type 2 diabetes mellitus without complications: Secondary | ICD-10-CM

## 2018-06-05 DIAGNOSIS — E785 Hyperlipidemia, unspecified: Secondary | ICD-10-CM | POA: Diagnosis not present

## 2018-06-05 DIAGNOSIS — I10 Essential (primary) hypertension: Secondary | ICD-10-CM

## 2018-06-05 DIAGNOSIS — Z0000 Encounter for general adult medical examination without abnormal findings: Secondary | ICD-10-CM

## 2018-06-05 MED ORDER — METFORMIN HCL 1000 MG PO TABS: 1000.0000 mg | ORAL_TABLET | Freq: Two times a day (BID) | ORAL | 3 refills | Status: DC

## 2018-06-05 MED ORDER — ALBUTEROL SULFATE HFA 108 (90 BASE) MCG/ACT IN AERS: 2.0000 | INHALATION_SPRAY | Freq: Four times a day (QID) | RESPIRATORY_TRACT | 0 refills | Status: DC | PRN

## 2018-06-05 MED ORDER — ATORVASTATIN CALCIUM 40 MG PO TABS: 40.0000 mg | ORAL_TABLET | Freq: Every day | ORAL | 3 refills | Status: DC

## 2018-06-05 NOTE — Assessment & Plan Note (Signed)
LDL and trigs above goal. Change from simvastatin to atorvastatin. Follow up in 6 months for repeat testing.

## 2018-06-05 NOTE — Assessment & Plan Note (Signed)
No recent use of Xanax, discouraged use.  Overall anxiety is stable, continue to monitor.

## 2018-06-05 NOTE — Assessment & Plan Note (Signed)
Recent A1C of 7.2 which is now a diagnosis of diabetes. Discussed to work on a healthy diet and regular exercise.  Will maximize metformin to 1000 mg BID.  Change simvastatin to atorvastatin for better lipid and triglyceride control. Managed on ACE.  Declines pneumonia vaccination. Foot exam today. Eye exam UTD.  Follow up in 6 months.

## 2018-06-05 NOTE — Progress Notes (Signed)
Subjective:    Patient ID: Destiny West, female    DOB: December 11, 1970, 48 y.o.   MRN: [id number]  HPI  Destiny West is a 48 year old female who presents today for complete physical.  Immunizations: -Tetanus: Completed in 2017 -Influenza: Declines -Pneumonia: Declines   Diet: She endorses a fair diet over the last one month Breakfast: Skips Lunch: Deli meat, eggs, veggies, chickpea salad, salad with protein Dinner: Meat, vegetable, rarely starch Snacks: Fruit, veggies Desserts: Once weekly, sometimes less Beverages: Water, seltzer water, diet soda  Exercise: She is exercising 3-4 days weekly Eye exam: Completed in June 2019 Dental exam: No recent exam Pap Smear: Completed over three years ago, due Mammogram: Completed in November 2019, due in May 2020  BP Readings from Last 3 Encounters:  06/05/18 126/82  08/31/17 124/76  06/03/17 122/76     Review of Systems  Constitutional: Negative for unexpected weight change.  HENT: Negative for rhinorrhea.   Respiratory: Negative for cough and shortness of breath.        Some wheezing over the last several weeks  Cardiovascular: Negative for chest pain.  Gastrointestinal: Negative for constipation and diarrhea.  Genitourinary: Negative for difficulty urinating and menstrual problem.  Musculoskeletal: Negative for arthralgias and myalgias.  Skin: Negative for rash.  Allergic/Immunologic: Negative for environmental allergies.  Neurological: Negative for dizziness, numbness and headaches.  Psychiatric/Behavioral:       Intermittent anxiety, overall managing well       Past Medical History:  Diagnosis Date  . Asthma   . Chicken pox   . Diabetes mellitus without complication ([place])    boderline  . Hyperlipidemia   . Hypertension      Social History   Socioeconomic History  . Marital status: Married    Spouse name: Not on file  . Number of children: Not on file  . Years of education: Not on file  . Highest education level:  Not on file  Occupational History  . Not on file  Social Needs  . Financial resource strain: Not on file  . Food insecurity:    Worry: Not on file    Inability: Not on file  . Transportation needs:    Medical: Not on file    Non-medical: Not on file  Tobacco Use  . Smoking status: Never Smoker  . Smokeless tobacco: Never Used  Substance and Sexual Activity  . Alcohol use: Yes    Alcohol/week: 0.0 standard drinks    Comment: rarely  . Drug use: Not on file  . Sexual activity: Not on file  Lifestyle  . Physical activity:    Days per week: Not on file    Minutes per session: Not on file  . Stress: Not on file  Relationships  . Social connections:    Talks on phone: Not on file    Gets together: Not on file    Attends religious service: Not on file    Active member of club or organization: Not on file    Attends meetings of clubs or organizations: Not on file    Relationship status: Not on file  . Intimate partner violence:    Fear of current or ex partner: Not on file    Emotionally abused: Not on file    Physically abused: Not on file    Forced sexual activity: Not on file  Other Topics Concern  . Not on file  Social History Narrative   Married.   No children.  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   Enjoys reading, traveling, movies.    Past Surgical History:  Procedure Laterality Date  . POLYPECTOMY     Uterine    Family History  Problem Relation Age of Onset  . Cancer Mother        breast  . Breast cancer Mother 7  . Heart disease Father   . Hypertension Father   . Heart disease Maternal Grandfather   . Hypertension Maternal Grandfather   . Diabetes Maternal Grandfather     No Known Allergies  Current Outpatient Medications on File Prior to Visit  Medication Sig Dispense Refill  . ALPRAZolam (XANAX) 0.25 MG tablet Take 0.25 mg by mouth daily as needed.  1  . amLODipine (NORVASC) 5 MG tablet TAKE 1 TABLET (5 MG  TOTAL) BY MOUTH DAILY. 90 tablet 1  . hydrochlorothiazide (HYDRODIURIL) 12.5 MG tablet TAKE 2 TABLETS BY MOUTH EVERY DAY 180 tablet 1  . losartan (COZAAR) 100 MG tablet Take 1 tablet (100 mg total) by mouth daily. For blood pressure. 90 tablet 0  . [DISCONTINUED] simvastatin (ZOCOR) 20 MG tablet TAKE 1 TABLET BY MOUTH EVERY DAY IN THE EVENING 90 tablet 1   No current facility-administered medications on file prior to visit.     BP 126/82   Pulse (!) 101   Temp 98.1 F (36.7 C) (Oral)   Ht 5' (1.524 m)   Wt 252 lb 8 oz (114.5 kg)   LMP 05/17/2018   SpO2 98%   BMI 49.31 kg/m    Objective:   Physical Exam  Constitutional: She is oriented to person, place, and time. She appears well-nourished.  HENT:  Mouth/Throat: No oropharyngeal exudate.  Eyes: Pupils are equal, round, and reactive to light. EOM are normal.  Neck: Neck supple. No thyromegaly present.  Cardiovascular: Normal rate and regular rhythm.  Respiratory: Effort normal and breath sounds normal.  GI: Soft. Bowel sounds are normal. There is no abdominal tenderness.  Musculoskeletal: Normal range of motion.  Neurological: She is alert and oriented to person, place, and time.  Skin: Skin is warm and dry.  Psychiatric: She has a normal mood and affect.           Assessment & Plan:

## 2018-06-05 NOTE — Assessment & Plan Note (Signed)
Tetanus UTD, declines influenza and pneumonia vaccinations.  Pap smear overdue, declines today. Mammogram UTD, due in May 2020. Discussed the importance of a healthy diet and regular exercise in order for weight loss, and to reduce the risk of any potential medical problems. Exam unremarkable Labs reviewed. Follow up in 1 year for CPE.

## 2018-06-05 NOTE — Assessment & Plan Note (Signed)
Stable in the office today, continue current regimen. BMP unremarkable.

## 2018-06-05 NOTE — Patient Instructions (Addendum)
I've switched your medication from simvastatin to atorvastatin. Take 1 tablet once daily for cholesterol.  We've increased the dose of your metformin to 1000 mg twice daily. I sent a new prescription to your pharmacy.  Start monitoring your blood pressure daily, around the same time of day, for the next 2-3 weeks.  Ensure that you have rested for 30 minutes prior to checking your blood pressure. Record your readings and notify me if you see readings at or above 135/90.   Continue exercising. You should be getting 150 minutes of moderate intensity exercise weekly.  Continue to work on a healthy diet. Ensure you are consuming 64 ounces of water daily.  Schedule a follow up visit for 6 months for diabetes check and pap smear.   It was a pleasure to see you today!   Preventive Care 40-64 Years, Female Preventive care refers to lifestyle choices and visits with your health care provider that can promote health and wellness. What does preventive care include?   A yearly physical exam. This is also called an annual well check.  Dental exams once or twice a year.  Routine eye exams. Ask your health care provider how often you should have your eyes checked.  Personal lifestyle choices, including: ? Daily care of your teeth and gums. ? Regular physical activity. ? Eating a healthy diet. ? Avoiding tobacco and drug use. ? Limiting alcohol use. ? Practicing safe sex. ? Taking low-dose aspirin daily starting at age 21. ? Taking vitamin and mineral supplements as recommended by your health care provider. What happens during an annual well check? The services and screenings done by your health care provider during your annual well check will depend on your age, overall health, lifestyle risk factors, and family history of disease. Counseling Your health care provider may ask you questions about your:  Alcohol use.  Tobacco use.  Drug use.  Emotional well-being.  Home and relationship  well-being.  Sexual activity.  Eating habits.  Work and work Statistician.  Method of birth control.  Menstrual cycle.  Pregnancy history. Screening You may have the following tests or measurements:  Height, weight, and BMI.  Blood pressure.  Lipid and cholesterol levels. These may be checked every 5 years, or more frequently if you are over 12 years old.  Skin check.  Lung cancer screening. You may have this screening every year starting at age 74 if you have a 30-pack-year history of smoking and currently smoke or have quit within the past 15 years.  Colorectal cancer screening. All adults should have this screening starting at age 68 and continuing until age 44. Your health care provider may recommend screening at age 21. You will have tests every 1-10 years, depending on your results and the type of screening test. People at increased risk should start screening at an earlier age. Screening tests may include: ? Guaiac-based fecal occult blood testing. ? Fecal immunochemical test (FIT). ? Stool DNA test. ? Virtual colonoscopy. ? Sigmoidoscopy. During this test, a flexible tube with a tiny camera (sigmoidoscope) is used to examine your rectum and lower colon. The sigmoidoscope is inserted through your anus into your rectum and lower colon. ? Colonoscopy. During this test, a long, thin, flexible tube with a tiny camera (colonoscope) is used to examine your entire colon and rectum.  Hepatitis C blood test.  Hepatitis B blood test.  Sexually transmitted disease (STD) testing.  Diabetes screening. This is done by checking your blood sugar (glucose) after you  have not eaten for a while (fasting). You may have this done every 1-3 years.  Mammogram. This may be done every 1-2 years. Talk to your health care provider about when you should start having regular mammograms. This may depend on whether you have a family history of breast cancer.  BRCA-related cancer screening. This  may be done if you have a family history of breast, ovarian, tubal, or peritoneal cancers.  Pelvic exam and Pap test. This may be done every 3 years starting at age 21. Starting at age 48, this may be done every 5 years if you have a Pap test in combination with an HPV test.  Bone density scan. This is done to screen for osteoporosis. You may have this scan if you are at high risk for osteoporosis. Discuss your test results, treatment options, and if necessary, the need for more tests with your health care provider. Vaccines Your health care provider may recommend certain vaccines, such as:  Influenza vaccine. This is recommended every year.  Tetanus, diphtheria, and acellular pertussis (Tdap, Td) vaccine. You may need a Td booster every 10 years.  Varicella vaccine. You may need this if you have not been vaccinated.  Zoster vaccine. You may need this after age 72.  Measles, mumps, and rubella (MMR) vaccine. You may need at least one dose of MMR if you were born in 1957 or later. You may also need a second dose.  Pneumococcal 13-valent conjugate (PCV13) vaccine. You may need this if you have certain conditions and were not previously vaccinated.  Pneumococcal polysaccharide (PPSV23) vaccine. You may need one or two doses if you smoke cigarettes or if you have certain conditions.  Meningococcal vaccine. You may need this if you have certain conditions.  Hepatitis A vaccine. You may need this if you have certain conditions or if you travel or work in places where you may be exposed to hepatitis A.  Hepatitis B vaccine. You may need this if you have certain conditions or if you travel or work in places where you may be exposed to hepatitis B.  Haemophilus influenzae type b (Hib) vaccine. You may need this if you have certain conditions. Talk to your health care provider about which screenings and vaccines you need and how often you need them. This information is not intended to replace  advice given to you by your health care provider. Make sure you discuss any questions you have with your health care provider. Document Released: 05/16/2015 Document Revised: 06/09/2017 Document Reviewed: 02/18/2015 Elsevier Interactive Patient Education  2019 [employer].

## 2018-06-05 NOTE — Assessment & Plan Note (Signed)
Symptoms overall infrequent, using albuterol inhaler 0-2 times weekly on average. Refill sent to pharmacy.

## 2018-06-21 ENCOUNTER — Other Ambulatory Visit: Payer: Self-pay | Admitting: Primary Care

## 2018-06-21 DIAGNOSIS — I10 Essential (primary) hypertension: Secondary | ICD-10-CM

## 2018-07-03 ENCOUNTER — Other Ambulatory Visit: Payer: Self-pay | Admitting: Primary Care

## 2018-08-07 ENCOUNTER — Other Ambulatory Visit: Payer: Self-pay | Admitting: Primary Care

## 2018-08-10 ENCOUNTER — Other Ambulatory Visit: Payer: Self-pay | Admitting: Primary Care

## 2018-08-10 DIAGNOSIS — I10 Essential (primary) hypertension: Secondary | ICD-10-CM

## 2018-08-10 MED ORDER — LOSARTAN POTASSIUM 100 MG PO TABS: 100.0000 mg | ORAL_TABLET | Freq: Every day | ORAL | 1 refills | Status: DC

## 2018-11-06 LAB — HM DIABETES EYE EXAM

## 2018-11-10 ENCOUNTER — Encounter: Payer: Self-pay | Admitting: Primary Care

## 2018-11-30 ENCOUNTER — Other Ambulatory Visit (HOSPITAL_COMMUNITY)
Admission: RE | Admit: 2018-11-30 | Discharge: 2018-11-30 | Disposition: A | Discharge status: Home / Self Care | Payer: BC Managed Care – PPO | Source: Ambulatory Visit | Attending: Internal Medicine | Admitting: Internal Medicine

## 2018-11-30 ENCOUNTER — Encounter: Payer: Self-pay | Admitting: Primary Care

## 2018-11-30 ENCOUNTER — Other Ambulatory Visit: Payer: Self-pay

## 2018-11-30 ENCOUNTER — Ambulatory Visit (INDEPENDENT_AMBULATORY_CARE_PROVIDER_SITE_OTHER): Payer: BC Managed Care – PPO | Admitting: Primary Care

## 2018-11-30 VITALS — BP 126/80 | HR 106 | Temp 97.8°F | Ht 60.0 in | Wt 242.5 lb

## 2018-11-30 DIAGNOSIS — E119 Type 2 diabetes mellitus without complications: Secondary | ICD-10-CM

## 2018-11-30 DIAGNOSIS — Z124 Encounter for screening for malignant neoplasm of cervix: Secondary | ICD-10-CM | POA: Diagnosis present

## 2018-11-30 DIAGNOSIS — E785 Hyperlipidemia, unspecified: Secondary | ICD-10-CM | POA: Diagnosis not present

## 2018-11-30 LAB — LIPID PANEL
Cholesterol: 177 mg/dL (ref 0–200)
HDL: 35.7 mg/dL — ABNORMAL LOW (ref >39.00)
NonHDL: 141.61
Total CHOL/HDL Ratio: 5
Triglycerides: 209 mg/dL — ABNORMAL HIGH (ref 0.0–149.0)
VLDL: 41.8 mg/dL — ABNORMAL HIGH (ref 0.0–40.0)

## 2018-11-30 LAB — LDL CHOLESTEROL, DIRECT: Direct LDL: 111 mg/dL

## 2018-11-30 LAB — HEMOGLOBIN A1C: Hgb A1c MFr Bld: 6.8 % — ABNORMAL HIGH (ref 4.6–6.5)

## 2018-11-30 NOTE — Progress Notes (Signed)
Subjective:    Patient ID: Destiny West, female    DOB: 05/04/1970, 48 y.o.   MRN: [id number]  HPI  Destiny West is a 48 year old female who presents today for follow up of diabetes and for pap smear. She underwent CPE in February 2020.  Current medications include: Metformin 1000 mg BID  Last A1C: 7.2 in February 2020 Last Eye Exam: Completed in July 2020 Last Foot Exam: Due in February 2021 Pneumonia Vaccination: Declines  ACE/ARB: Losartan  Statin: Atorvastatin   Diet currently consists of:  Breakfast: Skips hard boiled eggs Lunch: Skips mostly, sometimes left overs, salad with protein Dinner: Grilled chicken, fish, vegetable, starch; infrequent take out food. Snacks: None lately  Desserts: 1-2 times weekly  Beverages: Water, Seltzer water, soda, little fluids throughout the day as she doesn't feel thirsty.  Exercise: She is not exercising    Review of Systems  Eyes: Negative for visual disturbance.  Respiratory: Negative for shortness of breath.   Cardiovascular: Negative for chest pain.  Neurological: Negative for dizziness and numbness.       Past Medical History:  Diagnosis Date  . Asthma   . Chicken pox   . Diabetes mellitus without complication ([place])    boderline  . Hyperlipidemia   . Hypertension      Social History   Socioeconomic History  . Marital status: Married    Spouse name: Not on file  . Number of children: Not on file  . Years of education: Not on file  . Highest education level: Not on file  Occupational History  . Not on file  Social Needs  . Financial resource strain: Not on file  . Food insecurity    Worry: Not on file    Inability: Not on file  . Transportation needs    Medical: Not on file    Non-medical: Not on file  Tobacco Use  . Smoking status: Never Smoker  . Smokeless tobacco: Never Used  Substance and Sexual Activity  . Alcohol use: Yes    Alcohol/week: 0.0 standard drinks    Comment: rarely  . Drug use: Not on  file  . Sexual activity: Not on file  Lifestyle  . Physical activity    Days per week: Not on file    Minutes per session: Not on file  . Stress: Not on file  Relationships  . Social Herbalist on phone: Not on file    Gets together: Not on file    Attends religious service: Not on file    Active member of club or organization: Not on file    Attends meetings of clubs or organizations: Not on file    Relationship status: Not on file  . Intimate partner violence    Fear of current or ex partner: Not on file    Emotionally abused: Not on file    Physically abused: Not on file    Forced sexual activity: Not on file  Other Topics Concern  . Not on file  Social History Narrative   Married.   No children.   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   Enjoys reading, traveling, movies.    Past Surgical History:  Procedure Laterality Date  . POLYPECTOMY     Uterine    Family History  Problem Relation Age of Onset  . Cancer Mother        breast  . Breast cancer Mother 65  .  Heart disease Father   . Hypertension Father   . Heart disease Maternal Grandfather   . Hypertension Maternal Grandfather   . Diabetes Maternal Grandfather     No Known Allergies  Current Outpatient Medications on File Prior to Visit  Medication Sig Dispense Refill  . albuterol (PROAIR HFA) 108 (90 Base) MCG/ACT inhaler Inhale 2 puffs into the lungs every 6 (six) hours as needed for wheezing or shortness of breath. 1 Inhaler 0  . ALPRAZolam (XANAX) 0.25 MG tablet Take 0.25 mg by mouth daily as needed.  1  . amLODipine (NORVASC) 5 MG tablet TAKE 1 TABLET (5 MG TOTAL) BY MOUTH DAILY. 90 tablet 1  . atorvastatin (LIPITOR) 40 MG tablet Take 1 tablet (40 mg total) by mouth daily. For cholesterol. 90 tablet 3  . hydrochlorothiazide (HYDRODIURIL) 12.5 MG tablet TAKE 2 TABLETS BY MOUTH EVERY DAY 180 tablet 1  . losartan (COZAAR) 100 MG tablet Take 1 tablet (100 mg total) by  mouth daily. For blood pressure. 90 tablet 1  . metFORMIN (GLUCOPHAGE) 1000 MG tablet Take 1 tablet (1,000 mg total) by mouth 2 (two) times daily with a meal. For diabetes. 180 tablet 3  . [DISCONTINUED] simvastatin (ZOCOR) 20 MG tablet TAKE 1 TABLET BY MOUTH EVERY DAY IN THE EVENING 90 tablet 1   No current facility-administered medications on file prior to visit.     BP 126/80   Pulse (!) 106   Temp 97.8 F (36.6 C) (Temporal)   Ht 5' (1.524 m)   Wt 242 lb 8 oz (110 kg)   LMP 11/23/2018   SpO2 97%   BMI 47.36 kg/m    Objective:   Physical Exam  Constitutional: She appears well-nourished.  Neck: Neck supple.  Cardiovascular: Normal rate and regular rhythm.  Respiratory: Effort normal and breath sounds normal.  Genitourinary: There is no tenderness or lesion on the right labia. There is no tenderness or lesion on the left labia. Cervix exhibits no motion tenderness and no discharge. Right adnexum displays no tenderness. Left adnexum displays no tenderness.    No vaginal discharge.   Skin: Skin is warm and dry.  Psychiatric: She has a normal mood and affect.           Assessment & Plan:

## 2018-11-30 NOTE — Patient Instructions (Signed)
Stop by the lab prior to leaving today. I will notify you of your results once received.   It is important that you improve your diet. Please limit carbohydrates in the form of white bread, rice, pasta, sweets, fast food, fried food, sugary drinks, etc. Increase your consumption of fresh fruits and vegetables, whole grains, lean protein.  Ensure you are consuming 64 ounces of water daily.  Start exercising. You should be getting 150 minutes of moderate intensity exercise weekly.  Please schedule a follow up appointment in 6 months for your annual physical.   It was a pleasure to see you today!

## 2018-11-30 NOTE — Assessment & Plan Note (Signed)
Repeat A1C pending today.  Managed on statin and ARB. Declines pneumonia vaccination. Foot and eye exams UTD. Continue Metformin for now.  Follow up in 6 months.

## 2018-11-30 NOTE — Assessment & Plan Note (Signed)
Pap smear completed today, pending.

## 2018-11-30 NOTE — Assessment & Plan Note (Signed)
Switched from simvastatin to atorvastatin, repeat lipids pending.

## 2018-12-04 LAB — CYTOLOGY - PAP
Diagnosis: NEGATIVE
HPV: NOT DETECTED

## 2018-12-15 ENCOUNTER — Other Ambulatory Visit: Payer: Self-pay | Admitting: Primary Care

## 2018-12-15 DIAGNOSIS — I10 Essential (primary) hypertension: Secondary | ICD-10-CM

## 2019-01-02 ENCOUNTER — Other Ambulatory Visit: Payer: Self-pay | Admitting: Primary Care

## 2019-01-02 DIAGNOSIS — Z1231 Encounter for screening mammogram for malignant neoplasm of breast: Secondary | ICD-10-CM

## 2019-01-30 ENCOUNTER — Other Ambulatory Visit: Payer: Self-pay | Admitting: Primary Care

## 2019-02-02 ENCOUNTER — Ambulatory Visit
Admission: RE | Admit: 2019-02-02 | Discharge: 2019-02-02 | Disposition: A | Discharge status: Home / Self Care | Payer: BC Managed Care – PPO | Source: Ambulatory Visit | Attending: Primary Care | Admitting: Primary Care

## 2019-02-02 DIAGNOSIS — Z1231 Encounter for screening mammogram for malignant neoplasm of breast: Secondary | ICD-10-CM | POA: Insufficient documentation

## 2019-02-10 ENCOUNTER — Other Ambulatory Visit: Payer: Self-pay | Admitting: Primary Care

## 2019-02-10 DIAGNOSIS — I10 Essential (primary) hypertension: Secondary | ICD-10-CM

## 2019-05-27 ENCOUNTER — Other Ambulatory Visit: Payer: Self-pay | Admitting: Primary Care

## 2019-05-27 DIAGNOSIS — E119 Type 2 diabetes mellitus without complications: Secondary | ICD-10-CM

## 2019-06-03 ENCOUNTER — Other Ambulatory Visit: Payer: Self-pay | Admitting: Primary Care

## 2019-06-03 DIAGNOSIS — I10 Essential (primary) hypertension: Secondary | ICD-10-CM

## 2019-06-03 DIAGNOSIS — E785 Hyperlipidemia, unspecified: Secondary | ICD-10-CM

## 2019-06-03 DIAGNOSIS — E119 Type 2 diabetes mellitus without complications: Secondary | ICD-10-CM

## 2019-06-06 ENCOUNTER — Other Ambulatory Visit: Payer: Self-pay

## 2019-06-06 ENCOUNTER — Other Ambulatory Visit (INDEPENDENT_AMBULATORY_CARE_PROVIDER_SITE_OTHER): Payer: BC Managed Care – PPO

## 2019-06-06 DIAGNOSIS — E785 Hyperlipidemia, unspecified: Secondary | ICD-10-CM | POA: Diagnosis not present

## 2019-06-06 DIAGNOSIS — I10 Essential (primary) hypertension: Secondary | ICD-10-CM | POA: Diagnosis not present

## 2019-06-06 DIAGNOSIS — E119 Type 2 diabetes mellitus without complications: Secondary | ICD-10-CM | POA: Diagnosis not present

## 2019-06-06 LAB — LIPID PANEL
Cholesterol: 188 mg/dL (ref 0–200)
HDL: 36.8 mg/dL — ABNORMAL LOW (ref >39.00)
NonHDL: 150.79
Total CHOL/HDL Ratio: 5
Triglycerides: 221 mg/dL — ABNORMAL HIGH (ref 0.0–149.0)
VLDL: 44.2 mg/dL — ABNORMAL HIGH (ref 0.0–40.0)

## 2019-06-06 LAB — COMPREHENSIVE METABOLIC PANEL
ALT: 29 U/L (ref 0–35)
AST: 20 U/L (ref 0–37)
Albumin: 4.4 g/dL (ref 3.5–5.2)
Alkaline Phosphatase: 43 U/L (ref 39–117)
BUN: 13 mg/dL (ref 6–23)
CO2: 28 mEq/L (ref 19–32)
Calcium: 9.8 mg/dL (ref 8.4–10.5)
Chloride: 99 mEq/L (ref 96–112)
Creatinine, Ser: 0.76 mg/dL (ref 0.40–1.20)
GFR: 81.7 mL/min (ref >60.00)
Glucose, Bld: 152 mg/dL — ABNORMAL HIGH (ref 70–99)
Potassium: 3.5 mEq/L (ref 3.5–5.1)
Sodium: 139 mEq/L (ref 135–145)
Total Bilirubin: 0.4 mg/dL (ref 0.2–1.2)
Total Protein: 7.4 g/dL (ref 6.0–8.3)

## 2019-06-06 LAB — CBC
HCT: 39.5 % (ref 36.0–46.0)
Hemoglobin: 13 g/dL (ref 12.0–15.0)
MCHC: 32.8 g/dL (ref 30.0–36.0)
MCV: 79.2 fl (ref 78.0–100.0)
Platelets: 393 10*3/uL (ref 150.0–400.0)
RBC: 4.98 Mil/uL (ref 3.87–5.11)
RDW: 14.7 % (ref 11.5–15.5)
WBC: 11.4 10*3/uL — ABNORMAL HIGH (ref 4.0–10.5)

## 2019-06-06 LAB — LDL CHOLESTEROL, DIRECT: Direct LDL: 123 mg/dL

## 2019-06-06 LAB — HEMOGLOBIN A1C: Hgb A1c MFr Bld: 7.3 % — ABNORMAL HIGH (ref 4.6–6.5)

## 2019-06-08 ENCOUNTER — Encounter: Payer: Self-pay | Admitting: Primary Care

## 2019-06-08 ENCOUNTER — Ambulatory Visit (INDEPENDENT_AMBULATORY_CARE_PROVIDER_SITE_OTHER): Payer: BC Managed Care – PPO | Admitting: Primary Care

## 2019-06-08 ENCOUNTER — Other Ambulatory Visit: Payer: Self-pay

## 2019-06-08 VITALS — BP 120/76 | HR 110 | Temp 96.8°F | Ht 60.0 in | Wt 246.2 lb

## 2019-06-08 DIAGNOSIS — Z0001 Encounter for general adult medical examination with abnormal findings: Secondary | ICD-10-CM

## 2019-06-08 DIAGNOSIS — E119 Type 2 diabetes mellitus without complications: Secondary | ICD-10-CM | POA: Diagnosis not present

## 2019-06-08 DIAGNOSIS — E785 Hyperlipidemia, unspecified: Secondary | ICD-10-CM

## 2019-06-08 DIAGNOSIS — Z0000 Encounter for general adult medical examination without abnormal findings: Secondary | ICD-10-CM

## 2019-06-08 DIAGNOSIS — I10 Essential (primary) hypertension: Secondary | ICD-10-CM

## 2019-06-08 DIAGNOSIS — F411 Generalized anxiety disorder: Secondary | ICD-10-CM | POA: Diagnosis not present

## 2019-06-08 DIAGNOSIS — J4520 Mild intermittent asthma, uncomplicated: Secondary | ICD-10-CM

## 2019-06-08 MED ORDER — ROSUVASTATIN CALCIUM 10 MG PO TABS: 10.0000 mg | ORAL_TABLET | Freq: Every evening | ORAL | 3 refills | Status: DC

## 2019-06-08 MED ORDER — SERTRALINE HCL 50 MG PO TABS: 50.0000 mg | ORAL_TABLET | Freq: Every day | ORAL | 1 refills | Status: DC

## 2019-06-08 NOTE — Assessment & Plan Note (Signed)
Stable, uncomplicated.

## 2019-06-08 NOTE — Assessment & Plan Note (Signed)
Tetanus UTD, declines pneumonia and influenza vaccinations. Pap smear UTD. Mammogram UTD. Declines colonoscopy. Discussed the importance of a healthy diet and regular exercise in order for weight loss, and to reduce the risk of any potential medical problems.  Exam today as noted. Labs reviewed.

## 2019-06-08 NOTE — Assessment & Plan Note (Signed)
LDL above goal on atorvastatin 40 mg. Switch to rosuvastatin 10 mg. Repeat lipids next visit.

## 2019-06-08 NOTE — Progress Notes (Signed)
Subjective:    Patient ID: Destiny West, female    DOB: [birth year], 49 y.o.   MRN: [id number]  HPI  This visit occurred during the SARS-CoV-2 public health emergency.  Safety protocols were in place, including screening questions prior to the visit, additional usage of staff PPE, and extensive cleaning of exam room while observing appropriate contact time as indicated for disinfecting solutions.   Destiny West is a 49 year old female who presents today for complete physical.  She would also like to discuss anxiety. Symptoms include chest tightness, feeling overwhelmed, sleep disturbance, feeling nauseated with certain foods, increased stress, forgetfulness, headaches, muscle tension, procrastination. GAD 7 score of 21 today. Symptoms began one year ago. She's been on Xanax in the past, used as needed. She thinks she was on Effexor in the past, unsure how she did.   Immunizations: -Tetanus: Completed in 2017 -Influenza: Declines  -Pneumonia: Never completed, declines  Diet: She endorses a healthy diet.  Exercise: She is not exercising  Eye exam: Completes annually Dental exam: Completes annually   Pap Smear: Completed in 2020, negative Mammogram: Completed in October 2020 Colonoscopy: Never completed, declines  BP Readings from Last 3 Encounters:  06/08/19 120/76  11/30/18 126/80  06/05/18 126/82      Review of Systems  Constitutional: Negative for unexpected weight change.  HENT: Negative for rhinorrhea.   Respiratory: Negative for cough and shortness of breath.   Cardiovascular: Negative for chest pain.  Gastrointestinal: Negative for constipation and diarrhea.  Genitourinary: Negative for difficulty urinating and menstrual problem.  Musculoskeletal: Negative for arthralgias and myalgias.  Skin: Negative for rash.  Allergic/Immunologic: Negative for environmental allergies.  Neurological: Negative for dizziness, numbness and headaches.  Psychiatric/Behavioral:       See  HPI       Past Medical History:  Diagnosis Date  . Asthma   . Chicken pox   . Diabetes mellitus without complication ([place])    boderline  . Hyperlipidemia   . Hypertension      Social History   Socioeconomic History  . Marital status: Married    Spouse name: Not on file  . Number of children: Not on file  . Years of education: Not on file  . Highest education level: Not on file  Occupational History  . Not on file  Tobacco Use  . Smoking status: Never Smoker  . Smokeless tobacco: Never Used  Substance and Sexual Activity  . Alcohol use: Yes    Alcohol/week: 0.0 standard drinks    Comment: rarely  . Drug use: Not on file  . Sexual activity: Not on file  Other Topics Concern  . Not on file  Social History Narrative   Married.   No children.   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   Enjoys reading, traveling, movies.   Social Determinants of Health   Financial Resource Strain:   . Difficulty of Paying Living Expenses: Not on file  Food Insecurity:   . Worried About Charity fundraiser in the Last Year: Not on file  . Ran Out of Food in the Last Year: Not on file  Transportation Needs:   . Lack of Transportation (Medical): Not on file  . Lack of Transportation (Non-Medical): Not on file  Physical Activity:   . Days of Exercise per Week: Not on file  . Minutes of Exercise per Session: Not on file  Stress:   . Feeling of Stress :  Not on file  Social Connections:   . Frequency of Communication with Friends and Family: Not on file  . Frequency of Social Gatherings with Friends and Family: Not on file  . Attends Religious Services: Not on file  . Active Member of Clubs or Organizations: Not on file  . Attends Archivist Meetings: Not on file  . Marital Status: Not on file  Intimate Partner Violence:   . Fear of Current or Ex-Partner: Not on file  . Emotionally Abused: Not on file  . Physically Abused: Not on file  .  Sexually Abused: Not on file    Past Surgical History:  Procedure Laterality Date  . POLYPECTOMY     Uterine    Family History  Problem Relation Age of Onset  . Cancer Mother        breast  . Breast cancer Mother 84  . Heart disease Father   . Hypertension Father   . Heart disease Maternal Grandfather   . Hypertension Maternal Grandfather   . Diabetes Maternal Grandfather     No Known Allergies  Current Outpatient Medications on File Prior to Visit  Medication Sig Dispense Refill  . albuterol (PROAIR HFA) 108 (90 Base) MCG/ACT inhaler Inhale 2 puffs into the lungs every 6 (six) hours as needed for wheezing or shortness of breath. 1 Inhaler 0  . amLODipine (NORVASC) 5 MG tablet TAKE 1 TABLET BY MOUTH EVERY DAY 90 tablet 1  . atorvastatin (LIPITOR) 40 MG tablet Take 1 tablet (40 mg total) by mouth daily. For cholesterol. 90 tablet 3  . hydrochlorothiazide (HYDRODIURIL) 12.5 MG tablet TAKE 2 TABLETS BY MOUTH EVERY DAY 180 tablet 1  . losartan (COZAAR) 100 MG tablet TAKE 1 TABLET BY MOUTH EVERY DAY FOR BLOOD PRESSURE 90 tablet 1  . metFORMIN (GLUCOPHAGE) 1000 MG tablet TAKE 1 TABLET (1,000 MG TOTAL) BY MOUTH 2 (TWO) TIMES DAILY WITH A MEAL. FOR DIABETES. 180 tablet 3  . [DISCONTINUED] simvastatin (ZOCOR) 20 MG tablet TAKE 1 TABLET BY MOUTH EVERY DAY IN THE EVENING 90 tablet 1   No current facility-administered medications on file prior to visit.    BP 120/76   Pulse (!) 110   Temp (!) 96.8 F (36 C) (Temporal)   Ht 5' (1.524 m)   Wt 246 lb 4 oz (111.7 kg)   LMP 05/31/2019   SpO2 98%   BMI 48.09 kg/m    Objective:   Physical Exam  Constitutional: She is oriented to person, place, and time. She appears well-nourished.  HENT:  Right Ear: Tympanic membrane and ear canal normal.  Left Ear: Tympanic membrane and ear canal normal.  Mouth/Throat: Oropharynx is clear and moist.  Eyes: Pupils are equal, round, and reactive to light. EOM are normal.  Cardiovascular: Normal  rate and regular rhythm.  Respiratory: Effort normal and breath sounds normal.  GI: Soft. Bowel sounds are normal. There is no abdominal tenderness.  Musculoskeletal:        General: Normal range of motion.     Cervical back: Neck supple.  Neurological: She is alert and oriented to person, place, and time. No cranial nerve deficit.  Reflex Scores:      Patellar reflexes are 2+ on the right side and 2+ on the left side. Skin: Skin is warm and dry.  Psychiatric: She has a normal mood and affect.           Assessment & Plan:

## 2019-06-08 NOTE — Assessment & Plan Note (Signed)
Stable in the office today, continue current regimen. CMP reviewed.

## 2019-06-08 NOTE — Assessment & Plan Note (Signed)
Daily anxiety for one year. GAD 7 score of 21 today.  Dicussed options for treatment, she elects for medication and therapy. Referral placed for therapy. Rx for Zoloft 50 mg sent to pharmacy.  Patient is to take 1/2 tablet daily for 8 days, then advance to 1 full tablet thereafter. We discussed possible side effects of headache, GI upset, drowsiness, and SI/HI. If thoughts of SI/HI develop, we discussed to present to the emergency immediately. Patient verbalized understanding.   Follow up in 6 weeks for re-evaluation.

## 2019-06-08 NOTE — Assessment & Plan Note (Signed)
Recent A1C of 7.3, likely stress induced, see HPI.  Continue Metformin for now. Switching statin therapy for better LDL control. Managed on ARB. Declines pneumonia vaccination. Eye exam UTD. Foot exam due next visit.  Follow up in 6 months.

## 2019-06-08 NOTE — Patient Instructions (Signed)
Start sertraline (Zoloft) 50 mg tablets for anxiety. Take 1/2 tablet by mouth for 6-8 days, then increase to 1 full tablet thereafter.  You will be contacted regarding your referral to therapy.  Please let us know if you have not been contacted within two weeks.   Continue to work on [employer]. Ensure you are consuming 64 ounces of water daily.  Please schedule a follow up visit for 6 weeks for anxiety/cholesterol check.   It was a pleasure to see you today!   Preventive Care 1-79 Years Old, Female Preventive care refers to visits with your health care provider and lifestyle choices that can promote health and wellness. This includes:  A yearly physical exam. This may also be called an annual well check.  Regular dental visits and eye exams.  Immunizations.  Screening for certain conditions.  Healthy lifestyle choices, such as eating a healthy diet, getting regular exercise, not using drugs or products that contain nicotine and tobacco, and limiting alcohol use. What can I expect for my preventive care visit? Physical exam Your health care provider will check your:  Height and weight. This may be used to calculate body mass index (BMI), which tells if you are at a healthy weight.  Heart rate and blood pressure.  Skin for abnormal spots. Counseling Your health care provider may ask you questions about your:  Alcohol, tobacco, and drug use.  Emotional well-being.  Home and relationship well-being.  Sexual activity.  Eating habits.  Work and work Statistician.  Method of birth control.  Menstrual cycle.  Pregnancy history. What immunizations do I need?  Influenza (flu) vaccine  This is recommended every year. Tetanus, diphtheria, and pertussis (Tdap) vaccine  You may need a Td booster every 10 years. Varicella (chickenpox) vaccine  You may need this if you have not been vaccinated. Zoster (shingles) vaccine  You may need this after age 5. Measles,  mumps, and rubella (MMR) vaccine  You may need at least one dose of MMR if you were born in 1957 or later. You may also need a second dose. Pneumococcal conjugate (PCV13) vaccine  You may need this if you have certain conditions and were not previously vaccinated. Pneumococcal polysaccharide (PPSV23) vaccine  You may need one or two doses if you smoke cigarettes or if you have certain conditions. Meningococcal conjugate (MenACWY) vaccine  You may need this if you have certain conditions. Hepatitis A vaccine  You may need this if you have certain conditions or if you travel or work in places where you may be exposed to hepatitis A. Hepatitis B vaccine  You may need this if you have certain conditions or if you travel or work in places where you may be exposed to hepatitis B. Haemophilus influenzae type b (Hib) vaccine  You may need this if you have certain conditions. Human papillomavirus (HPV) vaccine  If recommended by your health care provider, you may need three doses over 6 months. You may receive vaccines as individual doses or as more than one vaccine together in one shot (combination vaccines). Talk with your health care provider about the risks and benefits of combination vaccines. What tests do I need? Blood tests  Lipid and cholesterol levels. These may be checked every 5 years, or more frequently if you are over 73 years old.  Hepatitis C test.  Hepatitis B test. Screening  Lung cancer screening. You may have this screening every year starting at age 35 if you have a 30-pack-year history of  smoking and currently smoke or have quit within the past 15 years.  Colorectal cancer screening. All adults should have this screening starting at age 64 and continuing until age 58. Your health care provider may recommend screening at age 43 if you are at increased risk. You will have tests every 1-10 years, depending on your results and the type of screening test.  Diabetes  screening. This is done by checking your blood sugar (glucose) after you have not eaten for a while (fasting). You may have this done every 1-3 years.  Mammogram. This may be done every 1-2 years. Talk with your health care provider about when you should start having regular mammograms. This may depend on whether you have a family history of breast cancer.  BRCA-related cancer screening. This may be done if you have a family history of breast, ovarian, tubal, or peritoneal cancers.  Pelvic exam and Pap test. This may be done every 3 years starting at age 55. Starting at age 30, this may be done every 5 years if you have a Pap test in combination with an HPV test. Other tests  Sexually transmitted disease (STD) testing.  Bone density scan. This is done to screen for osteoporosis. You may have this scan if you are at high risk for osteoporosis. Follow these instructions at home: Eating and drinking  Eat a diet that includes fresh fruits and vegetables, whole grains, lean protein, and low-fat dairy.  Take vitamin and mineral supplements as recommended by your health care provider.  Do not drink alcohol if: ? Your health care provider tells you not to drink. ? You are pregnant, may be pregnant, or are planning to become pregnant.  If you drink alcohol: ? Limit how much you have to 0-1 drink a day. ? Be aware of how much alcohol is in your drink. In the [place] one drink equals one 12 oz bottle of beer (355 mL), one 5 oz glass of wine (148 mL), or one 1 oz glass of hard liquor (44 mL). Lifestyle  Take daily care of your teeth and gums.  Stay active. Exercise for at least 30 minutes on 5 or more days each week.  Do not use any products that contain nicotine or tobacco, such as cigarettes, e-cigarettes, and chewing tobacco. If you need help quitting, ask your health care provider.  If you are sexually active, practice safe sex. Use a condom or other form of birth control (contraception) in  order to prevent pregnancy and STIs (sexually transmitted infections).  If told by your health care provider, take low-dose aspirin daily starting at age 44. What's next?  Visit your health care provider once a year for a well check visit.  Ask your health care provider how often you should have your eyes and teeth checked.  Stay up to date on all vaccines. This information is not intended to replace advice given to you by your health care provider. Make sure you discuss any questions you have with your health care provider. Document Revised: 12/29/2017 Document Reviewed: 12/29/2017 Elsevier Patient Education  2020 [employer].

## 2019-06-13 ENCOUNTER — Other Ambulatory Visit: Payer: Self-pay | Admitting: Primary Care

## 2019-06-13 DIAGNOSIS — I10 Essential (primary) hypertension: Secondary | ICD-10-CM

## 2019-06-17 ENCOUNTER — Other Ambulatory Visit: Payer: Self-pay | Admitting: Primary Care

## 2019-06-17 DIAGNOSIS — E785 Hyperlipidemia, unspecified: Secondary | ICD-10-CM

## 2019-06-30 ENCOUNTER — Other Ambulatory Visit: Payer: Self-pay | Admitting: Primary Care

## 2019-06-30 DIAGNOSIS — F411 Generalized anxiety disorder: Secondary | ICD-10-CM

## 2019-07-20 ENCOUNTER — Other Ambulatory Visit: Payer: Self-pay

## 2019-07-20 ENCOUNTER — Ambulatory Visit: Payer: BC Managed Care – PPO | Admitting: Primary Care

## 2019-07-20 VITALS — BP 124/80 | HR 98 | Temp 96.9°F | Ht 60.0 in | Wt 245.5 lb

## 2019-07-20 DIAGNOSIS — E785 Hyperlipidemia, unspecified: Secondary | ICD-10-CM | POA: Diagnosis not present

## 2019-07-20 DIAGNOSIS — F411 Generalized anxiety disorder: Secondary | ICD-10-CM | POA: Diagnosis not present

## 2019-07-20 LAB — LIPID PANEL
Cholesterol: 150 mg/dL (ref 0–200)
HDL: 41.4 mg/dL (ref >39.00)
LDL Cholesterol: 72 mg/dL (ref 0–99)
NonHDL: 109.08
Total CHOL/HDL Ratio: 4
Triglycerides: 186 mg/dL — ABNORMAL HIGH (ref 0.0–149.0)
VLDL: 37.2 mg/dL (ref 0.0–40.0)

## 2019-07-20 MED ORDER — SERTRALINE HCL 50 MG PO TABS: 50.0000 mg | ORAL_TABLET | Freq: Every day | ORAL | 2 refills | Status: DC

## 2019-07-20 NOTE — Patient Instructions (Signed)
Stop by the lab prior to leaving today. I will notify you of your results once received.   Continue sertraline (Zoloft) for anxiety/depression.  Please schedule a follow up appointment in 5 months for diabetes check.  It was a pleasure to see you today!

## 2019-07-20 NOTE — Assessment & Plan Note (Signed)
Compliant to Crestor, repeat lipids pending.

## 2019-07-20 NOTE — Progress Notes (Signed)
Subjective:    Patient ID: Destiny West, female    DOB: [birth year], 49 y.o.   MRN: [id number]  HPI  This visit occurred during the SARS-CoV-2 public health emergency.  Safety protocols were in place, including screening questions prior to the visit, additional usage of staff PPE, and extensive cleaning of exam room while observing appropriate contact time as indicated for disinfecting solutions.   Destiny West is a 49 year old female with a history of hypertension, asthma, type 2 diabetes, GAD, hyperlipidemia  who presents today for follow up of anxiety and repeat lipids.   She was last evaluated on 06/08/19 for her CPE when she mentioned symptoms of anxiety including feeling overwhelmed, nausea, muscle tension, procrastination. GAD 7 score of 21 so we initiated Zoloft 50 mg and asked her to follow up.  Since her last visit she's doing better. Positive effects include feeling overwhelmed and less anxiety. Overall sleeping "okay". She has yet to pick up Melatonin. She denies SI/HI, GI upset. She's able to get through her day without significant problems.   BP Readings from Last 3 Encounters:  07/20/19 124/80  06/08/19 120/76  11/30/18 126/80     Review of Systems  Gastrointestinal: Negative for abdominal pain and nausea.  Neurological: Negative for dizziness and headaches.  Psychiatric/Behavioral: Negative for suicidal ideas.       See HPI       Past Medical History:  Diagnosis Date  . Asthma   . Chicken pox   . Diabetes mellitus without complication ([place])    boderline  . Hyperlipidemia   . Hypertension      Social History   Socioeconomic History  . Marital status: Married    Spouse name: Not on file  . Number of children: Not on file  . Years of education: Not on file  . Highest education level: Not on file  Occupational History  . Not on file  Tobacco Use  . Smoking status: Never Smoker  . Smokeless tobacco: Never Used  Substance and Sexual Activity  . Alcohol use:  Yes    Alcohol/week: 0.0 standard drinks    Comment: rarely  . Drug use: Not on file  . Sexual activity: Not on file  Other Topics Concern  . Not on file  Social History Narrative   Married.   No children.   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   Enjoys reading, traveling, movies.   Social Determinants of Health   Financial Resource Strain:   . Difficulty of Paying Living Expenses:   Food Insecurity:   . Worried About Charity fundraiser in the Last Year:   . Arboriculturist in the Last Year:   Transportation Needs:   . Film/video editor (Medical):   Marland Kitchen Lack of Transportation (Non-Medical):   Physical Activity:   . Days of Exercise per Week:   . Minutes of Exercise per Session:   Stress:   . Feeling of Stress :   Social Connections:   . Frequency of Communication with Friends and Family:   . Frequency of Social Gatherings with Friends and Family:   . Attends Religious Services:   . Active Member of Clubs or Organizations:   . Attends Archivist Meetings:   Marland Kitchen Marital Status:   Intimate Partner Violence:   . Fear of Current or Ex-Partner:   . Emotionally Abused:   Marland Kitchen Physically Abused:   . Sexually Abused:  Past Surgical History:  Procedure Laterality Date  . POLYPECTOMY     Uterine    Family History  Problem Relation Age of Onset  . Cancer Mother        breast  . Breast cancer Mother 27  . Heart disease Father   . Hypertension Father   . Heart disease Maternal Grandfather   . Hypertension Maternal Grandfather   . Diabetes Maternal Grandfather     No Known Allergies  Current Outpatient Medications on File Prior to Visit  Medication Sig Dispense Refill  . albuterol (PROAIR HFA) 108 (90 Base) MCG/ACT inhaler Inhale 2 puffs into the lungs every 6 (six) hours as needed for wheezing or shortness of breath. 1 Inhaler 0  . amLODipine (NORVASC) 5 MG tablet TAKE 1 TABLET BY MOUTH EVERY DAY 90 tablet 1  .  hydrochlorothiazide (HYDRODIURIL) 12.5 MG tablet TAKE 2 TABLETS BY MOUTH EVERY DAY 180 tablet 1  . losartan (COZAAR) 100 MG tablet TAKE 1 TABLET BY MOUTH EVERY DAY FOR BLOOD PRESSURE 90 tablet 1  . metFORMIN (GLUCOPHAGE) 1000 MG tablet TAKE 1 TABLET (1,000 MG TOTAL) BY MOUTH 2 (TWO) TIMES DAILY WITH A MEAL. FOR DIABETES. 180 tablet 3  . rosuvastatin (CRESTOR) 10 MG tablet Take 1 tablet (10 mg total) by mouth every evening. For cholesterol. 90 tablet 3  . [DISCONTINUED] simvastatin (ZOCOR) 20 MG tablet TAKE 1 TABLET BY MOUTH EVERY DAY IN THE EVENING 90 tablet 1   No current facility-administered medications on file prior to visit.    BP 124/80   Pulse 98   Temp (!) 96.9 F (36.1 C) (Temporal)   Ht 5' (1.524 m)   Wt 245 lb 8 oz (111.4 kg)   LMP 07/01/2019   SpO2 98%   BMI 47.95 kg/m    Objective:   Physical Exam  Constitutional: She appears well-nourished.  Cardiovascular: Normal rate and regular rhythm.  Respiratory: Effort normal and breath sounds normal.  Musculoskeletal:     Cervical back: Neck supple.  Skin: Skin is warm and dry.  Psychiatric: She has a normal mood and affect.           Assessment & Plan:

## 2019-07-20 NOTE — Assessment & Plan Note (Signed)
Improved and seems to be doing much better on Zoloft 50 mg. No SI/HI.  Continue same, refills sent to pharmacy.

## 2019-07-28 ENCOUNTER — Other Ambulatory Visit: Payer: Self-pay | Admitting: Primary Care

## 2019-08-07 ENCOUNTER — Other Ambulatory Visit: Payer: Self-pay | Admitting: Primary Care

## 2019-08-07 DIAGNOSIS — I10 Essential (primary) hypertension: Secondary | ICD-10-CM

## 2019-08-27 ENCOUNTER — Ambulatory Visit: Payer: BC Managed Care – PPO | Admitting: Family Medicine

## 2019-08-27 ENCOUNTER — Other Ambulatory Visit: Payer: Self-pay

## 2019-08-27 ENCOUNTER — Encounter: Payer: Self-pay | Admitting: Family Medicine

## 2019-08-27 VITALS — BP 128/62 | HR 76 | Temp 98.1°F | Ht 60.0 in | Wt 242.0 lb

## 2019-08-27 DIAGNOSIS — B029 Zoster without complications: Secondary | ICD-10-CM | POA: Diagnosis not present

## 2019-08-27 MED ORDER — VALACYCLOVIR HCL 1 G PO TABS: 1000.0000 mg | ORAL_TABLET | Freq: Three times a day (TID) | ORAL | 0 refills | Status: AC

## 2019-08-27 NOTE — Patient Instructions (Signed)
If rash changes, gets worse, drains, spreads, please follow up  Call [facility] to get appointment today or tomorrow- referral has been placed  Shingles  Shingles, which is also known as herpes zoster, is an infection that causes a painful skin rash and fluid-filled blisters. It is caused by a virus. Shingles only develops in people who:  Have had chickenpox.  Have been given a medicine to protect against chickenpox (have been vaccinated). Shingles is rare in this group. What are the causes? Shingles is caused by varicella-zoster virus (VZV). This is the same virus that causes chickenpox. After a person is exposed to VZV, the virus stays in the body in an inactive (dormant) state. Shingles develops if the virus is reactivated. This can happen many years after the first (initial) exposure to VZV. It is not known what causes this virus to be reactivated. What increases the risk? People who have had chickenpox or received the chickenpox vaccine are at risk for shingles. Shingles infection is more common in people who:  Are older than age 48.  Have a weakened disease-fighting system (immune system), such as people with: ? HIV. ? AIDS. ? Cancer.  Are taking medicines that weaken the immune system, such as transplant medicines.  Are experiencing a lot of stress. What are the signs or symptoms? Early symptoms of this condition include itching, tingling, and pain in an area on your skin. Pain may be described as burning, stabbing, or throbbing. A few days or weeks after early symptoms start, a painful red rash appears. The rash is usually on one side of the body and has a band-like or belt-like pattern. The rash eventually turns into fluid-filled blisters that break open, change into scabs, and dry up in about 2-3 weeks. At any time during the infection, you may also develop:  A fever.  Chills.  A headache.  An upset stomach. How is this diagnosed? This condition is diagnosed with a  skin exam. Skin or fluid samples may be taken from the blisters before a diagnosis is made. These samples are examined under a microscope or sent to a lab for testing. How is this treated? The rash may last for several weeks. There is not a specific cure for this condition. Your health care provider will probably prescribe medicines to help you manage pain, recover more quickly, and avoid long-term problems. Medicines may include:  Antiviral drugs.  Anti-inflammatory drugs.  Pain medicines.  Anti-itching medicines (antihistamines). If the area involved is on your face, you may be referred to a specialist, such as an eye doctor (ophthalmologist) or an ear, nose, and throat (ENT) doctor (otolaryngologist) to help you avoid eye problems, chronic pain, or disability. Follow these instructions at home: Medicines  Take over-the-counter and prescription medicines only as told by your health care provider.  Apply an anti-itch cream or numbing cream to the affected area as told by your health care provider. Relieving itching and discomfort   Apply cold, wet cloths (cold compresses) to the area of the rash or blisters as told by your health care provider.  Cool baths can be soothing. Try adding baking soda or dry oatmeal to the water to reduce itching. Do not bathe in hot water. Blister and rash care  Keep your rash covered with a loose bandage (dressing). Wear loose-fitting clothing to help ease the pain of material rubbing against the rash.  Keep your rash and blisters clean by washing the area with mild soap and cool water as told  by your health care provider.  Check your rash every day for signs of infection. Check for: ? More redness, swelling, or pain. ? Fluid or blood. ? Warmth. ? Pus or a bad smell.  Do not scratch your rash or pick at your blisters. To help avoid scratching: ? Keep your fingernails clean and cut short. ? Wear gloves or mittens while you sleep, if scratching is a  problem. General instructions  Rest as told by your health care provider.  Keep all follow-up visits as told by your health care provider. This is important.  Wash your hands often with soap and water. If soap and water are not available, use hand sanitizer. Doing this lowers your chance of getting a bacterial skin infection.  Before your blisters change into scabs, your shingles infection can cause chickenpox in people who have never had it or have never been vaccinated against it. To prevent this from happening, avoid contact with other people, especially: ? Babies. ? Pregnant women. ? Children who have eczema. ? Elderly people who have transplants. ? People who have chronic illnesses, such as cancer or AIDS. Contact a health care provider if:  Your pain is not relieved with prescribed medicines.  Your pain does not get better after the rash heals.  You have signs of infection in the rash area, such as: ? More redness, swelling, or pain around the rash. ? Fluid or blood coming from the rash. ? The rash area feeling warm to the touch. ? Pus or a bad smell coming from the rash. Get help right away if:  The rash is on your face or nose.  You have facial pain, pain around your eye area, or loss of feeling on one side of your face.  You have difficulty seeing.  You have ear pain or have ringing in your ear.  You have a loss of taste.  Your condition gets worse. Summary  Shingles, which is also known as herpes zoster, is an infection that causes a painful skin rash and fluid-filled blisters.  This condition is diagnosed with a skin exam. Skin or fluid samples may be taken from the blisters and examined before the diagnosis is made.  Keep your rash covered with a loose bandage (dressing). Wear loose-fitting clothing to help ease the pain of material rubbing against the rash.  Before your blisters change into scabs, your shingles infection can cause chickenpox in people who  have never had it or have never been vaccinated against it. This information is not intended to replace advice given to you by your health care provider. Make sure you discuss any questions you have with your health care provider. Document Revised: 08/11/2018 Document Reviewed: 12/22/2016 Elsevier Patient Education  2020 [employer].

## 2019-08-27 NOTE — Progress Notes (Signed)
   Subjective:    Patient ID: Destiny West, female    DOB: 1971-01-12, 49 y.o.   MRN: [id number]  HPI Chief Complaint  Patient presents with  . Other    Possible shingles? Red, tender bumps on left side of scalp and forehead - also feels like "sand" in left eye    This is a 49 yo female who presents today with above cc. Noticed first lesions 08/24/19, does not recall pre dromal symptoms. No systemic symptoms. Lesions on left side of forehead, scalp, ? Lesion on left eyelid. Denies blurred vision, double vision or decreased visual acuity. Feels like sand in eye. Scalp slightly painful. Forehead sensitive to touch. Had chicken pox as a child. 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 Has not taken any medication for symptoms.    Review of Systems Per HPI    Objective:   Physical Exam Vitals reviewed.  Constitutional:      General: She is not in acute distress.    Appearance: Normal appearance. She is obese. She is not ill-appearing, toxic-appearing or diaphoretic.  HENT:     Head: Normocephalic and atraumatic.  Eyes:     Conjunctiva/sclera: Conjunctivae normal.  Cardiovascular:     Rate and Rhythm: Normal rate.  Pulmonary:     Effort: Pulmonary effort is normal.  Skin:    General: Skin is warm and dry.     Findings: Rash present.     Comments: Left side of forehead with multiple erythematous papules, ? Resolving vesicle. Scattered lesions upper, left scalp. No drainage. Single papule on left upper eyelid. No other apparent eye involvement.   Neurological:     Mental Status: She is alert and oriented to person, place, and time.  Psychiatric:        Mood and Affect: Mood normal.        Behavior: Behavior normal.        Thought Content: Thought content normal.        Judgment: Judgment normal.       BP 128/62 (BP Location: Left Arm, Patient Position: Sitting, Cuff Size: Normal)   Pulse 76   Temp 98.1 F (36.7 C) (Temporal)   Ht 5' (1.524 m)    Wt 242 lb (109.8 kg)   SpO2 98%   BMI 47.26 kg/m      Assessment & Plan:  1. Herpes zoster without complication -Provided written and verbal information regarding diagnosis and treatment. - Follow up instructions reviewed - a little outside treatment window with antiviral but given proximity to eye and new lesions on scalp, will give course of valacyclovir - she will call for ophthalmology appointment, referral placed, if she has any difficulty getting seen in next 1-2 days, she will let me know - Ambulatory referral to Ophthalmology - valACYclovir (VALTREX) 1000 MG tablet; Take 1 tablet (1,000 mg total) by mouth 3 (three) times daily for 7 days.  Dispense: 21 tablet; Refill: 0  This visit occurred during the SARS-CoV-2 public health emergency.  Safety protocols were in place, including screening questions prior to the visit, additional usage of staff PPE, and extensive cleaning of exam room while observing appropriate contact time as indicated for disinfecting solutions.    Clarene Reamer, FNP-BC  [facility] at [facility], [facility]  08/27/2019 10:35 AM

## 2019-08-28 LAB — HM DIABETES EYE EXAM

## 2019-09-03 ENCOUNTER — Ambulatory Visit (INDEPENDENT_AMBULATORY_CARE_PROVIDER_SITE_OTHER): Payer: BC Managed Care – PPO | Admitting: Psychology

## 2019-09-03 DIAGNOSIS — F411 Generalized anxiety disorder: Secondary | ICD-10-CM

## 2019-09-24 ENCOUNTER — Ambulatory Visit (INDEPENDENT_AMBULATORY_CARE_PROVIDER_SITE_OTHER): Payer: BC Managed Care – PPO | Admitting: Psychology

## 2019-09-24 DIAGNOSIS — F410 Panic disorder [episodic paroxysmal anxiety] without agoraphobia: Secondary | ICD-10-CM

## 2019-10-15 ENCOUNTER — Ambulatory Visit (INDEPENDENT_AMBULATORY_CARE_PROVIDER_SITE_OTHER): Payer: BC Managed Care – PPO | Admitting: Psychology

## 2019-10-15 DIAGNOSIS — F410 Panic disorder [episodic paroxysmal anxiety] without agoraphobia: Secondary | ICD-10-CM | POA: Diagnosis not present

## 2019-11-06 ENCOUNTER — Ambulatory Visit: Payer: BC Managed Care – PPO | Admitting: Psychology

## 2019-12-07 ENCOUNTER — Other Ambulatory Visit: Payer: Self-pay | Admitting: Primary Care

## 2019-12-07 DIAGNOSIS — I10 Essential (primary) hypertension: Secondary | ICD-10-CM

## 2019-12-20 ENCOUNTER — Other Ambulatory Visit: Payer: Self-pay

## 2019-12-20 ENCOUNTER — Encounter: Payer: Self-pay | Admitting: Primary Care

## 2019-12-20 ENCOUNTER — Ambulatory Visit (INDEPENDENT_AMBULATORY_CARE_PROVIDER_SITE_OTHER): Payer: BC Managed Care – PPO | Admitting: Primary Care

## 2019-12-20 VITALS — BP 136/80 | HR 92 | Temp 95.5°F | Ht 60.0 in | Wt 230.2 lb

## 2019-12-20 DIAGNOSIS — E119 Type 2 diabetes mellitus without complications: Secondary | ICD-10-CM | POA: Diagnosis not present

## 2019-12-20 LAB — POCT GLYCOSYLATED HEMOGLOBIN (HGB A1C): Hemoglobin A1C: 6.1 % — AB (ref 4.0–5.6)

## 2019-12-20 NOTE — Assessment & Plan Note (Signed)
Well controlled in the office today with A1C reduced to 6.1!! Commended her on weight loss, changes in diet, exercise. Encouraged to continue.  Continue metformin 1000 mg BID. Managed on statin and ACE Declines pneumonia vaccine. Foot exam today. Eye exam UTD.  Follow up in 6 months.

## 2019-12-20 NOTE — Patient Instructions (Signed)
Continue taking Metformin 1000 mg twice daily.  Continue exercising. You should be getting 150 minutes of moderate intensity exercise weekly.  Congratulations on your weight loss!!!  We will see you in 6 months for your physical.  It was a pleasure to see you today!

## 2019-12-20 NOTE — Progress Notes (Signed)
Subjective:    Patient ID: Destiny West, female    DOB: Jul 09, 1970, 49 y.o.   MRN: [id number]  HPI  This visit occurred during the SARS-CoV-2 public health emergency.  Safety protocols were in place, including screening questions prior to the visit, additional usage of staff PPE, and extensive cleaning of exam room while observing appropriate contact time as indicated for disinfecting solutions.   Destiny West is a 49 year old female with a history of hypertension, asthma, diabetes, hyperlipidemia, GAD who presents today for follow up of diabetes.  Current medications include: Metformin 1000 mg BID  She is checking her blood glucose _ times daily and is getting readings of  Last A1C: 7.3 in February 2021, 6.1 today  Last Eye Exam: UTD Last Foot Exam: Due Pneumonia Vaccination: Declines  ACE/ARB: Losartan  Statin: Crestor  Wt Readings from Last 3 Encounters:  12/20/19 230 lb 4 oz (104.4 kg)  08/27/19 242 lb (109.8 kg)  07/20/19 245 lb 8 oz (111.4 kg)   Since her last visit she's been counting calories, has been participating in [place] for weight loss, walking on her treadmill 4-5 times weekly.   BP Readings from Last 3 Encounters:  12/20/19 136/80  08/27/19 128/62  07/20/19 124/80     Review of Systems  Eyes: Negative for visual disturbance.  Respiratory: Negative for shortness of breath.   Cardiovascular: Negative for chest pain.  Neurological: Negative for dizziness and numbness.       Past Medical History:  Diagnosis Date  . Asthma   . Chicken pox   . Diabetes mellitus without complication ([place])    boderline  . Hyperlipidemia   . Hypertension      Social History   Socioeconomic History  . Marital status: Married    Spouse name: Not on file  . Number of children: Not on file  . Years of education: Not on file  . Highest education level: Not on file  Occupational History  . Not on file  Tobacco Use  . Smoking status: Never Smoker  . Smokeless tobacco: Never  Used  Substance and Sexual Activity  . Alcohol use: Yes    Alcohol/week: 0.0 standard drinks    Comment: rarely  . Drug use: Not on file  . Sexual activity: Not on file  Other Topics Concern  . Not on file  Social History Narrative   Married.   No children.   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   Enjoys reading, traveling, movies.   Social Determinants of Health   Financial Resource Strain:   . Difficulty of Paying Living Expenses: Not on file  Food Insecurity:   . Worried About Charity fundraiser in the Last Year: Not on file  . Ran Out of Food in the Last Year: Not on file  Transportation Needs:   . Lack of Transportation (Medical): Not on file  . Lack of Transportation (Non-Medical): Not on file  Physical Activity:   . Days of Exercise per Week: Not on file  . Minutes of Exercise per Session: Not on file  Stress:   . Feeling of Stress : Not on file  Social Connections:   . Frequency of Communication with Friends and Family: Not on file  . Frequency of Social Gatherings with Friends and Family: Not on file  . Attends Religious Services: Not on file  . Active Member of Clubs or Organizations: Not on file  . Attends  Club or Organization Meetings: Not on file  . Marital Status: Not on file  Intimate Partner Violence:   . Fear of Current or Ex-Partner: Not on file  . Emotionally Abused: Not on file  . Physically Abused: Not on file  . Sexually Abused: Not on file    Past Surgical History:  Procedure Laterality Date  . POLYPECTOMY     Uterine    Family History  Problem Relation Age of Onset  . Cancer Mother        breast  . Breast cancer Mother 56  . Heart disease Father   . Hypertension Father   . Heart disease Maternal Grandfather   . Hypertension Maternal Grandfather   . Diabetes Maternal Grandfather     No Known Allergies  Current Outpatient Medications on File Prior to Visit  Medication Sig Dispense Refill  .  albuterol (PROAIR HFA) 108 (90 Base) MCG/ACT inhaler Inhale 2 puffs into the lungs every 6 (six) hours as needed for wheezing or shortness of breath. 1 Inhaler 0  . amLODipine (NORVASC) 5 MG tablet TAKE 1 TABLET BY MOUTH EVERY DAY 90 tablet 0  . DUREZOL 0.05 % EMUL Place 1 drop into the left eye 2 (two) times daily.    . hydrochlorothiazide (HYDRODIURIL) 12.5 MG tablet TAKE 2 TABLETS BY MOUTH EVERY DAY 180 tablet 1  . losartan (COZAAR) 100 MG tablet TAKE 1 TABLET BY MOUTH EVERY DAY FOR BLOOD PRESSURE 90 tablet 1  . metFORMIN (GLUCOPHAGE) 1000 MG tablet TAKE 1 TABLET (1,000 MG TOTAL) BY MOUTH 2 (TWO) TIMES DAILY WITH A MEAL. FOR DIABETES. 180 tablet 3  . rosuvastatin (CRESTOR) 10 MG tablet Take 1 tablet (10 mg total) by mouth every evening. For cholesterol. 90 tablet 3  . sertraline (ZOLOFT) 50 MG tablet Take 1 tablet (50 mg total) by mouth daily. For anxiety and depression. 90 tablet 2  . VALTREX 1 g tablet Take 1,000 mg by mouth 3 (three) times daily.    . [DISCONTINUED] simvastatin (ZOCOR) 20 MG tablet TAKE 1 TABLET BY MOUTH EVERY DAY IN THE EVENING 90 tablet 1   No current facility-administered medications on file prior to visit.    BP 136/80   Pulse 92   Temp (!) 95.5 F (35.3 C) (Temporal)   Ht 5' (1.524 m)   Wt 230 lb 4 oz (104.4 kg)   LMP 12/11/2019   SpO2 98%   BMI 44.97 kg/m    Objective:   Physical Exam Cardiovascular:     Rate and Rhythm: Normal rate and regular rhythm.  Pulmonary:     Effort: Pulmonary effort is normal.     Breath sounds: Normal breath sounds.  Musculoskeletal:     Cervical back: Neck supple.  Skin:    General: Skin is warm and dry.            Assessment & Plan:

## 2020-01-23 ENCOUNTER — Other Ambulatory Visit: Payer: Self-pay

## 2020-01-23 ENCOUNTER — Other Ambulatory Visit: Payer: Self-pay | Admitting: Primary Care

## 2020-01-23 DIAGNOSIS — Z1231 Encounter for screening mammogram for malignant neoplasm of breast: Secondary | ICD-10-CM

## 2020-02-01 ENCOUNTER — Other Ambulatory Visit: Payer: Self-pay | Admitting: Primary Care

## 2020-02-01 DIAGNOSIS — I10 Essential (primary) hypertension: Secondary | ICD-10-CM

## 2020-02-18 ENCOUNTER — Ambulatory Visit
Admission: RE | Admit: 2020-02-18 | Discharge: 2020-02-18 | Disposition: A | Discharge status: Home / Self Care | Payer: BC Managed Care – PPO | Source: Ambulatory Visit | Attending: Primary Care | Admitting: Primary Care

## 2020-02-18 ENCOUNTER — Other Ambulatory Visit: Payer: Self-pay

## 2020-02-18 DIAGNOSIS — Z1231 Encounter for screening mammogram for malignant neoplasm of breast: Secondary | ICD-10-CM | POA: Insufficient documentation

## 2020-03-04 ENCOUNTER — Other Ambulatory Visit: Payer: Self-pay | Admitting: Primary Care

## 2020-03-04 DIAGNOSIS — I10 Essential (primary) hypertension: Secondary | ICD-10-CM

## 2020-03-04 NOTE — Telephone Encounter (Signed)
Last OV 12/20/19 Last fill 12/07/19  #90/0

## 2020-04-23 ENCOUNTER — Other Ambulatory Visit: Payer: Self-pay | Admitting: Primary Care

## 2020-04-23 DIAGNOSIS — F411 Generalized anxiety disorder: Secondary | ICD-10-CM

## 2020-05-20 ENCOUNTER — Other Ambulatory Visit: Payer: Self-pay | Admitting: Primary Care

## 2020-05-20 DIAGNOSIS — E119 Type 2 diabetes mellitus without complications: Secondary | ICD-10-CM

## 2020-05-30 ENCOUNTER — Other Ambulatory Visit: Payer: Self-pay | Admitting: Primary Care

## 2020-05-30 DIAGNOSIS — E785 Hyperlipidemia, unspecified: Secondary | ICD-10-CM

## 2020-05-31 ENCOUNTER — Other Ambulatory Visit: Payer: Self-pay | Admitting: Primary Care

## 2020-05-31 DIAGNOSIS — I10 Essential (primary) hypertension: Secondary | ICD-10-CM

## 2020-06-05 ENCOUNTER — Other Ambulatory Visit: Payer: Self-pay | Admitting: Primary Care

## 2020-06-05 DIAGNOSIS — E119 Type 2 diabetes mellitus without complications: Secondary | ICD-10-CM

## 2020-06-05 DIAGNOSIS — E785 Hyperlipidemia, unspecified: Secondary | ICD-10-CM

## 2020-06-09 ENCOUNTER — Other Ambulatory Visit: Payer: Self-pay

## 2020-06-09 ENCOUNTER — Other Ambulatory Visit (INDEPENDENT_AMBULATORY_CARE_PROVIDER_SITE_OTHER): Payer: BC Managed Care – PPO

## 2020-06-09 DIAGNOSIS — E119 Type 2 diabetes mellitus without complications: Secondary | ICD-10-CM | POA: Diagnosis not present

## 2020-06-09 DIAGNOSIS — E785 Hyperlipidemia, unspecified: Secondary | ICD-10-CM

## 2020-06-09 LAB — COMPREHENSIVE METABOLIC PANEL
ALT: 18 U/L (ref 0–35)
AST: 19 U/L (ref 0–37)
Albumin: 4.3 g/dL (ref 3.5–5.2)
Alkaline Phosphatase: 36 U/L — ABNORMAL LOW (ref 39–117)
BUN: 13 mg/dL (ref 6–23)
CO2: 27 mEq/L (ref 19–32)
Calcium: 9.6 mg/dL (ref 8.4–10.5)
Chloride: 102 mEq/L (ref 96–112)
Creatinine, Ser: 0.73 mg/dL (ref 0.40–1.20)
GFR: 97.78 mL/min (ref >60.00)
Glucose, Bld: 137 mg/dL — ABNORMAL HIGH (ref 70–99)
Potassium: 3.9 mEq/L (ref 3.5–5.1)
Sodium: 139 mEq/L (ref 135–145)
Total Bilirubin: 0.3 mg/dL (ref 0.2–1.2)
Total Protein: 7.1 g/dL (ref 6.0–8.3)

## 2020-06-09 LAB — LIPID PANEL
Cholesterol: 165 mg/dL (ref 0–200)
HDL: 43.9 mg/dL (ref >39.00)
LDL Cholesterol: 89 mg/dL (ref 0–99)
NonHDL: 121.39
Total CHOL/HDL Ratio: 4
Triglycerides: 163 mg/dL — ABNORMAL HIGH (ref 0.0–149.0)
VLDL: 32.6 mg/dL (ref 0.0–40.0)

## 2020-06-09 LAB — HEMOGLOBIN A1C: Hgb A1c MFr Bld: 6.9 % — ABNORMAL HIGH (ref 4.6–6.5)

## 2020-06-11 ENCOUNTER — Other Ambulatory Visit: Payer: Self-pay

## 2020-06-11 ENCOUNTER — Ambulatory Visit (INDEPENDENT_AMBULATORY_CARE_PROVIDER_SITE_OTHER): Payer: BC Managed Care – PPO | Admitting: Primary Care

## 2020-06-11 ENCOUNTER — Encounter: Payer: Self-pay | Admitting: Primary Care

## 2020-06-11 VITALS — BP 124/62 | HR 95 | Temp 97.4°F | Ht 60.0 in | Wt 233.0 lb

## 2020-06-11 DIAGNOSIS — Z0000 Encounter for general adult medical examination without abnormal findings: Secondary | ICD-10-CM

## 2020-06-11 DIAGNOSIS — Z8619 Personal history of other infectious and parasitic diseases: Secondary | ICD-10-CM | POA: Insufficient documentation

## 2020-06-11 DIAGNOSIS — E119 Type 2 diabetes mellitus without complications: Secondary | ICD-10-CM

## 2020-06-11 DIAGNOSIS — J4520 Mild intermittent asthma, uncomplicated: Secondary | ICD-10-CM

## 2020-06-11 DIAGNOSIS — I10 Essential (primary) hypertension: Secondary | ICD-10-CM | POA: Diagnosis not present

## 2020-06-11 DIAGNOSIS — F411 Generalized anxiety disorder: Secondary | ICD-10-CM

## 2020-06-11 DIAGNOSIS — E785 Hyperlipidemia, unspecified: Secondary | ICD-10-CM

## 2020-06-11 DIAGNOSIS — B0230 Zoster ocular disease, unspecified: Secondary | ICD-10-CM

## 2020-06-11 DIAGNOSIS — Z1211 Encounter for screening for malignant neoplasm of colon: Secondary | ICD-10-CM | POA: Diagnosis not present

## 2020-06-11 DIAGNOSIS — Z6841 Body Mass Index (BMI) 40.0 and over, adult: Secondary | ICD-10-CM

## 2020-06-11 MED ORDER — SERTRALINE HCL 100 MG PO TABS: 100.0000 mg | ORAL_TABLET | Freq: Every day | ORAL | 1 refills | Status: DC

## 2020-06-11 NOTE — Assessment & Plan Note (Signed)
Well controlled in the office today, continue amlodipine 5 mg, HCTZ 25 mg, losartan 100 mg. CMP reviewed.

## 2020-06-11 NOTE — Assessment & Plan Note (Signed)
Diagnosed 9 months ago, follows with ophthalmology every 3 weeks. Continue current regimen.

## 2020-06-11 NOTE — Assessment & Plan Note (Signed)
Controlled on rosuvastatin 10 mg, continue same.

## 2020-06-11 NOTE — Assessment & Plan Note (Signed)
Increased to 6.9 on recent labs but still overall good control. She will work on her diet and resume exercise.   Continue Metformin 1000 mg BID. Managed on statin and ARB. Foot and eye exams UTD. Declines pneumonia vaccine.   Follow up in 6 months.

## 2020-06-11 NOTE — Patient Instructions (Signed)
We increased your sertraline (Zoloft) to 100 mg. Increase to 1 and 1/2 tablet for 1-2 weeks, then increase to 100 mg thereafter.   Start exercising. You should be getting 150 minutes of moderate intensity exercise weekly.  It's important to improve your diet by reducing consumption of fast food, fried food, processed snack foods, sugary drinks. Increase consumption of fresh vegetables and fruits, whole grains, water.  Ensure you are drinking 64 ounces of water daily.  You will be contacted regarding your referral to Gi for the colonoscopy.  Please let us know if you have not been contacted within two weeks.   Please schedule a follow up appointment in 6 months for your diabetes check.   It was a pleasure to see you today!   Preventive Care 70-13 Years Old, Female Preventive care refers to lifestyle choices and visits with your health care provider that can promote health and wellness. This includes:  A yearly physical exam. This is also called an annual wellness visit.  Regular dental and eye exams.  Immunizations.  Screening for certain conditions.  Healthy lifestyle choices, such as: ? Eating a healthy diet. ? Getting regular exercise. ? Not using drugs or products that contain nicotine and tobacco. ? Limiting alcohol use. What can I expect for my preventive care visit? Physical exam Your health care provider will check your:  Height and weight. These may be used to calculate your BMI (body mass index). BMI is a measurement that tells if you are at a healthy weight.  Heart rate and blood pressure.  Body temperature.  Skin for abnormal spots. Counseling Your health care provider may ask you questions about your:  Past medical problems.  Family's medical history.  Alcohol, tobacco, and drug use.  Emotional well-being.  Home life and relationship well-being.  Sexual activity.  Diet, exercise, and sleep habits.  Work and work Astronomer.  Access to  firearms.  Method of birth control.  Menstrual cycle.  Pregnancy history. What immunizations do I need? Vaccines are usually given at various ages, according to a schedule. Your health care provider will recommend vaccines for you based on your age, medical history, and lifestyle or other factors, such as travel or where you work.   What tests do I need? Blood tests  Lipid and cholesterol levels. These may be checked every 5 years, or more often if you are over 50 years old.  Hepatitis C test.  Hepatitis B test. Screening  Lung cancer screening. You may have this screening every year starting at age 22 if you have a 30-pack-year history of smoking and currently smoke or have quit within the past 15 years.  Colorectal cancer screening. ? All adults should have this screening starting at age 51 and continuing until age 48. ? Your health care provider may recommend screening at age 62 if you are at increased risk. ? You will have tests every 1-10 years, depending on your results and the type of screening test.  Diabetes screening. ? This is done by checking your blood sugar (glucose) after you have not eaten for a while (fasting). ? You may have this done every 1-3 years.  Mammogram. ? This may be done every 1-2 years. ? Talk with your health care provider about when you should start having regular mammograms. This may depend on whether you have a family history of breast cancer.  BRCA-related cancer screening. This may be done if you have a family history of breast, ovarian, tubal, or peritoneal  cancers.  Pelvic exam and Pap test. ? This may be done every 3 years starting at age 66. ? Starting at age 74, this may be done every 5 years if you have a Pap test in combination with an HPV test. Other tests  STD (sexually transmitted disease) testing, if you are at risk.  Bone density scan. This is done to screen for osteoporosis. You may have this scan if you are at high risk for  osteoporosis. Talk with your health care provider about your test results, treatment options, and if necessary, the need for more tests. Follow these instructions at home: Eating and drinking  Eat a diet that includes fresh fruits and vegetables, whole grains, lean protein, and low-fat dairy products.  Take vitamin and mineral supplements as recommended by your health care provider.  Do not drink alcohol if: ? Your health care provider tells you not to drink. ? You are pregnant, may be pregnant, or are planning to become pregnant.  If you drink alcohol: ? Limit how much you have to 0-1 drink a day. ? Be aware of how much alcohol is in your drink. In the [place] one drink equals one 12 oz bottle of beer (355 mL), one 5 oz glass of wine (148 mL), or one 1 oz glass of hard liquor (44 mL).   Lifestyle  Take daily care of your teeth and gums. Brush your teeth every morning and night with fluoride toothpaste. Floss one time each day.  Stay active. Exercise for at least 30 minutes 5 or more days each week.  Do not use any products that contain nicotine or tobacco, such as cigarettes, e-cigarettes, and chewing tobacco. If you need help quitting, ask your health care provider.  Do not use drugs.  If you are sexually active, practice safe sex. Use a condom or other form of protection to prevent STIs (sexually transmitted infections).  If you do not wish to become pregnant, use a form of birth control. If you plan to become pregnant, see your health care provider for a prepregnancy visit.  If told by your health care provider, take low-dose aspirin daily starting at age 36.  Find healthy ways to cope with stress, such as: ? Meditation, yoga, or listening to music. ? Journaling. ? Talking to a trusted person. ? Spending time with friends and family. Safety  Always wear your seat belt while driving or riding in a vehicle.  Do not drive: ? If you have been drinking alcohol. Do not ride  with someone who has been drinking. ? When you are tired or distracted. ? While texting.  Wear a helmet and other protective equipment during sports activities.  If you have firearms in your house, make sure you follow all gun safety procedures. What's next?  Visit your health care provider once a year for an annual wellness visit.  Ask your health care provider how often you should have your eyes and teeth checked.  Stay up to date on all vaccines. This information is not intended to replace advice given to you by your health care provider. Make sure you discuss any questions you have with your health care provider. Document Revised: 01/22/2020 Document Reviewed: 12/29/2017 Elsevier Patient Education  2021 [employer].

## 2020-06-11 NOTE — Assessment & Plan Note (Signed)
Declines pneumonia and influenza vaccines. Mammogram UTD. Pap smear UTD, due in 2023. Colonoscopy due, referral placed to GI.  Discussed the importance of a healthy diet and regular exercise in order for weight loss, and to reduce the risk of any potential medical problems.  Exam today stable. Labs reviewed.

## 2020-06-11 NOTE — Assessment & Plan Note (Signed)
Commended her on prior weight loss, encouraged to continue.

## 2020-06-11 NOTE — Assessment & Plan Note (Signed)
Infrequent use of albuterol, no wheezing on exam.  Continue PRN albuterol.

## 2020-06-11 NOTE — Progress Notes (Signed)
Subjective:    Patient ID: Destiny West, female    DOB: Sep 17, 1970, 50 y.o.   MRN: [id number]  HPI  This visit occurred during the SARS-CoV-2 public health emergency.  Safety protocols were in place, including screening questions prior to the visit, additional usage of staff PPE, and extensive cleaning of exam room while observing appropriate contact time as indicated for disinfecting solutions.   Destiny West is a 50 year old female who presents today for complete physical.  She doesn't feel as though her Zoloft is "working". She's feeling "in a funk", had to leave work early yesterday. Also feeling down/sad, fatigued, over thinking things, mind racing thoughts She's felt this way for a few months. She is compliant to her Zoloft 50 mg, believed it was very effective at one point.   Immunizations: -Tetanus: 2017 -Influenza: Declines  -Pneumonia: Never completed, declines  -Covid-19: Completed 3 vaccines  Diet: She endorses a poor diet.  Exercise: She is not exercising   Eye exam: Completes monthly  Dental exam: No recent exam  Pap Smear: 2020, negative Mammogram: October 2021 Colonoscopy: Never completed  Hep C Screen: Due  BP Readings from Last 3 Encounters:  06/11/20 124/62  12/20/19 136/80  08/27/19 128/62   Wt Readings from Last 3 Encounters:  06/11/20 233 lb (105.7 kg)  12/20/19 230 lb 4 oz (104.4 kg)  08/27/19 242 lb (109.8 kg)     Review of Systems  Constitutional: Negative for unexpected weight change.  HENT: Negative for rhinorrhea.   Eyes: Positive for visual disturbance.       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   Cardiovascular: Negative for chest pain.  Gastrointestinal: Negative for constipation and diarrhea.  Genitourinary: Negative for difficulty urinating.  Musculoskeletal: Negative for arthralgias and myalgias.  Skin: Negative for rash.  Allergic/Immunologic:  Negative for environmental allergies.  Neurological: Negative for dizziness, numbness and headaches.  Psychiatric/Behavioral:       See HPI       Past Medical History:  Diagnosis Date  . Asthma   . Chicken pox   . Diabetes mellitus without complication ([place])    boderline  . Hyperlipidemia   . Hypertension      Social History   Socioeconomic History  . Marital status: Married    Spouse name: Not on file  . Number of children: Not on file  . Years of education: Not on file  . Highest education level: Not on file  Occupational History  . Not on file  Tobacco Use  . Smoking status: Never Smoker  . Smokeless tobacco: Never Used  Substance and Sexual Activity  . Alcohol use: Yes    Alcohol/week: 0.0 standard drinks    Comment: rarely  . Drug use: Not on file  . Sexual activity: Not on file  Other Topics Concern  . Not on file  Social History Narrative   Married.   No children.   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   Enjoys reading, traveling, movies.   Social Determinants of Health   Financial Resource Strain: Not on file  Food Insecurity: Not on file  Transportation Needs: Not on file  Physical Activity: Not on file  Stress: Not on file  Social Connections: Not on file  Intimate Partner Violence: Not on file    Past Surgical History:  Procedure Laterality Date  . POLYPECTOMY  Uterine    Family History  Problem Relation Age of Onset  . Cancer Mother        breast  . Breast cancer Mother 69  . Heart disease Father   . Hypertension Father   . Heart disease Maternal Grandfather   . Hypertension Maternal Grandfather   . Diabetes Maternal Grandfather     No Known Allergies  Current Outpatient Medications on File Prior to Visit  Medication Sig Dispense Refill  . albuterol (PROAIR HFA) 108 (90 Base) MCG/ACT inhaler Inhale 2 puffs into the lungs every 6 (six) hours as needed for wheezing or shortness of breath. 1 Inhaler  0  . amLODipine (NORVASC) 5 MG tablet TAKE 1 TABLET BY MOUTH EVERY DAY 90 tablet 0  . DUREZOL 0.05 % EMUL Place 1 drop into the left eye 2 (two) times daily.    . hydrochlorothiazide (HYDRODIURIL) 12.5 MG tablet TAKE 2 TABLETS BY MOUTH EVERY DAY 180 tablet 1  . losartan (COZAAR) 100 MG tablet TAKE 1 TABLET BY MOUTH EVERY DAY FOR BLOOD PRESSURE 90 tablet 1  . metFORMIN (GLUCOPHAGE) 1000 MG tablet TAKE 1 TABLET (1,000 MG TOTAL) BY MOUTH 2 (TWO) TIMES DAILY WITH A MEAL. FOR DIABETES. 180 tablet 0  . rosuvastatin (CRESTOR) 10 MG tablet TAKE 1 TABLET (10 MG TOTAL) BY MOUTH EVERY EVENING. FOR CHOLESTEROL. 90 tablet 0  . [DISCONTINUED] simvastatin (ZOCOR) 20 MG tablet TAKE 1 TABLET BY MOUTH EVERY DAY IN THE EVENING 90 tablet 1   No current facility-administered medications on file prior to visit.    BP 124/62   Pulse 95   Temp (!) 97.4 F (36.3 C) (Temporal)   Ht 5' (1.524 m)   Wt 233 lb (105.7 kg)   SpO2 98%   BMI 45.50 kg/m    Objective:   Physical Exam Constitutional:      Appearance: She is well-nourished.  HENT:     Right Ear: Tympanic membrane and ear canal normal.     Left Ear: Tympanic membrane and ear canal normal.     Mouth/Throat:     Mouth: Oropharynx is clear and moist.  Eyes:     Extraocular Movements: EOM normal.     Pupils: Pupils are equal, round, and reactive to light.  Cardiovascular:     Rate and Rhythm: Normal rate and regular rhythm.  Pulmonary:     Effort: Pulmonary effort is normal.     Breath sounds: Normal breath sounds.  Abdominal:     General: Bowel sounds are normal.     Palpations: Abdomen is soft.     Tenderness: There is no abdominal tenderness.  Musculoskeletal:        General: Normal range of motion.     Cervical back: Neck supple.  Skin:    General: Skin is warm and dry.  Neurological:     Mental Status: She is alert and oriented to person, place, and time.     Cranial Nerves: No cranial nerve deficit.     Deep Tendon Reflexes:      Reflex Scores:      Patellar reflexes are 2+ on the right side and 2+ on the left side. Psychiatric:        Mood and Affect: Mood and affect and mood normal.            Assessment & Plan:

## 2020-06-11 NOTE — Assessment & Plan Note (Addendum)
Deteriorated with some depression as well. Given that Zoloft was effective for her previously we will increase Zoloft to 75 mg x 1-2 weeks, then increase to 100 mg. New Rx sent for 100 mg.

## 2020-07-09 ENCOUNTER — Other Ambulatory Visit: Payer: Self-pay

## 2020-07-09 ENCOUNTER — Ambulatory Visit: Payer: BC Managed Care – PPO | Admitting: Gastroenterology

## 2020-07-09 ENCOUNTER — Encounter: Payer: Self-pay | Admitting: Gastroenterology

## 2020-07-09 VITALS — BP 137/83 | HR 87 | Ht 60.0 in | Wt 243.1 lb

## 2020-07-09 DIAGNOSIS — R195 Other fecal abnormalities: Secondary | ICD-10-CM | POA: Diagnosis not present

## 2020-07-09 DIAGNOSIS — R109 Unspecified abdominal pain: Secondary | ICD-10-CM | POA: Diagnosis not present

## 2020-07-09 DIAGNOSIS — Z1211 Encounter for screening for malignant neoplasm of colon: Secondary | ICD-10-CM

## 2020-07-09 MED ORDER — CLENPIQ 10-3.5-12 MG-GM -GM/160ML PO SOLN: 320.0000 mL | Freq: Once | ORAL | 0 refills | Status: AC

## 2020-07-09 NOTE — Progress Notes (Signed)
Cephas Darby, MD [address]  [place]  [place], [place] [address]  Main: [phone]  Fax: [phone]    Gastroenterology Consultation  Referring Provider:     Pleas Koch, NP Primary Care Physician:  Pleas Koch, NP Primary Gastroenterologist:  Dr. Cephas Darby Reason for Consultation:     Abdominal cramps, intermittent diarrhea        HPI:   Destiny West is a 50 y.o. female referred by Dr. Carlis Abbott, Leticia Penna, NP  for consultation & management of abdominal cramps intermittent diarrhea.  Patient with history of metabolic syndrome reports that she has been experiencing approximately 2 months history of mild intermittent abdominal cramps followed by an episode of nonbloody loose bowel movement.  She denies any abdominal bloating this occurs about 2-3 times a week.  She denies any weight loss.  She does acknowledge that her diet is not well-balanced.  Her labs are unremarkable She does not smoke or drink alcohol She denies any families of GI malignancy, IBD  NSAIDs: None  Antiplts/Anticoagulants/Anti thrombotics: None  GI Procedures: None  Past Medical History:  Diagnosis Date  . Asthma   . Chicken pox   . Diabetes mellitus without complication ([place])    boderline  . Hyperlipidemia   . Hypertension     Past Surgical History:  Procedure Laterality Date  . POLYPECTOMY     Uterine    Current Outpatient Medications:  .  albuterol (PROAIR HFA) 108 (90 Base) MCG/ACT inhaler, Inhale 2 puffs into the lungs every 6 (six) hours as needed for wheezing or shortness of breath., Disp: 1 Inhaler, Rfl: 0 .  amLODipine (NORVASC) 5 MG tablet, TAKE 1 TABLET BY MOUTH EVERY DAY, Disp: 90 tablet, Rfl: 0 .  DUREZOL 0.05 % EMUL, Place 1 drop into the left eye 2 (two) times daily., Disp: , Rfl:  .  hydrochlorothiazide (HYDRODIURIL) 12.5 MG tablet, TAKE 2 TABLETS BY MOUTH EVERY DAY, Disp: 180 tablet, Rfl: 1 .  losartan (COZAAR) 100 MG tablet, TAKE 1 TABLET BY MOUTH  EVERY DAY FOR BLOOD PRESSURE, Disp: 90 tablet, Rfl: 1 .  metFORMIN (GLUCOPHAGE) 1000 MG tablet, TAKE 1 TABLET (1,000 MG TOTAL) BY MOUTH 2 (TWO) TIMES DAILY WITH A MEAL. FOR DIABETES., Disp: 180 tablet, Rfl: 0 .  rosuvastatin (CRESTOR) 10 MG tablet, TAKE 1 TABLET (10 MG TOTAL) BY MOUTH EVERY EVENING. FOR CHOLESTEROL., Disp: 90 tablet, Rfl: 0 .  sertraline (ZOLOFT) 100 MG tablet, Take 1 tablet (100 mg total) by mouth daily. For anxiety, Disp: 90 tablet, Rfl: 1 .  Sod Picosulfate-Mag Ox-Cit Acd (CLENPIQ) 10-3.5-12 MG-GM -GM/160ML SOLN, Take 320 mLs by mouth once for 1 dose., Disp: 320 mL, Rfl: 0 .  valACYclovir (VALTREX) 500 MG tablet, Take 500 mg by mouth daily., Disp: , Rfl:  .  XIIDRA 5 % SOLN, , Disp: , Rfl:    Family History  Problem Relation Age of Onset  . Cancer Mother        breast  . Breast cancer Mother 76  . Heart disease Father   . Hypertension Father   . Heart disease Maternal Grandfather   . Hypertension Maternal Grandfather   . Diabetes Maternal Grandfather      Social History   Tobacco Use  . Smoking status: Never Smoker  . Smokeless tobacco: Never Used  Substance Use Topics  . Alcohol use: Yes    Alcohol/week: 0.0 standard drinks    Comment: rarely    Allergies as of  07/09/2020  . (No Known Allergies)    Review of Systems:    All systems reviewed and negative except where noted in HPI.   Physical Exam:  BP 137/83 (BP Location: Left Arm, Patient Position: Sitting, Cuff Size: Large)   Pulse 87   Ht 5' (1.524 m)   Wt 243 lb 2 oz (110.3 kg)   BMI 47.48 kg/m  No LMP recorded.  General:   Alert,  Well-developed, well-nourished, pleasant and cooperative in NAD Head:  Normocephalic and atraumatic. Eyes:  Sclera clear, no icterus.   Conjunctiva pink. Ears:  Normal auditory acuity. Nose:  No deformity, discharge, or lesions. Mouth:  No deformity or lesions,oropharynx pink & moist. Neck:  Supple; no masses or thyromegaly. Lungs:  Respirations even and  unlabored.  Clear throughout to auscultation.   No wheezes, crackles, or rhonchi. No acute distress. Heart:  Regular rate and rhythm; no murmurs, clicks, rubs, or gallops. Abdomen:  Normal bowel sounds. Soft, non-tender and non-distended without masses, hepatosplenomegaly or hernias noted.  No guarding or rebound tenderness.   Rectal: Not performed Msk:  Symmetrical without gross deformities. Good, equal movement & strength bilaterally. Pulses:  Normal pulses noted. Extremities:  No clubbing or edema.  No cyanosis. Neurologic:  Alert and oriented x3;  grossly normal neurologically. Skin:  Intact without significant lesions or rashes. No jaundice. Psych:  Alert and cooperative. Normal mood and affect.  Imaging Studies: No abdominal imaging  Assessment and Plan:   Destiny West is a 50 y.o. pleasant Caucasian female with history of metabolic syndrome is seen in consultation for intermittent mild abdominal cramps followed by occasional nonbloody diarrhea, appears to be clinically insignificant at this time, most likely diet related.  Reiterated on healthy lifestyle and balanced nutrition.  She will contact me if her symptoms are worse  Schedule colonoscopy for colon cancer screening   Follow up as needed   Cephas Darby, MD

## 2020-07-16 ENCOUNTER — Telehealth: Payer: Self-pay | Admitting: Gastroenterology

## 2020-07-16 NOTE — Telephone Encounter (Signed)
Patient states she does not want to reschedule the procedure at this time. Called Endo and talk to Rapelje and cancel the procedure

## 2020-07-16 NOTE — Telephone Encounter (Signed)
Patient called LVM to cancel her procedure 08/12/20

## 2020-07-24 ENCOUNTER — Other Ambulatory Visit: Payer: Self-pay | Admitting: Primary Care

## 2020-07-24 DIAGNOSIS — I10 Essential (primary) hypertension: Secondary | ICD-10-CM

## 2020-07-24 DIAGNOSIS — F411 Generalized anxiety disorder: Secondary | ICD-10-CM

## 2020-07-31 ENCOUNTER — Other Ambulatory Visit: Payer: Self-pay | Admitting: Primary Care

## 2020-07-31 DIAGNOSIS — I10 Essential (primary) hypertension: Secondary | ICD-10-CM

## 2020-08-12 ENCOUNTER — Ambulatory Visit: Admit: 2020-08-12 | Payer: BC Managed Care – PPO | Admitting: Gastroenterology

## 2020-08-12 SURGERY — COLONOSCOPY WITH PROPOFOL
Anesthesia: General

## 2020-08-16 ENCOUNTER — Other Ambulatory Visit: Payer: Self-pay | Admitting: Primary Care

## 2020-08-16 DIAGNOSIS — E119 Type 2 diabetes mellitus without complications: Secondary | ICD-10-CM

## 2020-08-26 ENCOUNTER — Other Ambulatory Visit: Payer: Self-pay | Admitting: Primary Care

## 2020-08-26 DIAGNOSIS — E785 Hyperlipidemia, unspecified: Secondary | ICD-10-CM

## 2020-08-27 ENCOUNTER — Other Ambulatory Visit: Payer: Self-pay | Admitting: Primary Care

## 2020-08-27 DIAGNOSIS — I10 Essential (primary) hypertension: Secondary | ICD-10-CM

## 2020-12-09 ENCOUNTER — Encounter: Payer: Self-pay | Admitting: Primary Care

## 2020-12-09 ENCOUNTER — Other Ambulatory Visit: Payer: Self-pay

## 2020-12-09 ENCOUNTER — Ambulatory Visit (INDEPENDENT_AMBULATORY_CARE_PROVIDER_SITE_OTHER): Payer: BC Managed Care – PPO | Admitting: Primary Care

## 2020-12-09 VITALS — BP 128/82 | HR 76 | Temp 97.7°F | Ht 60.0 in | Wt 240.0 lb

## 2020-12-09 DIAGNOSIS — E119 Type 2 diabetes mellitus without complications: Secondary | ICD-10-CM

## 2020-12-09 DIAGNOSIS — M5431 Sciatica, right side: Secondary | ICD-10-CM

## 2020-12-09 LAB — POCT GLYCOSYLATED HEMOGLOBIN (HGB A1C): Hemoglobin A1C: 6.5 % — AB (ref 4.0–5.6)

## 2020-12-09 MED ORDER — PREDNISONE 20 MG PO TABS: ORAL_TABLET | ORAL | 0 refills | Status: DC

## 2020-12-09 NOTE — Progress Notes (Signed)
Subjective:    Patient ID: Destiny West, female    DOB: 1970-08-23, 50 y.o.   MRN: [id number]  HPI  Destiny West is a very pleasant 49 y.o. female with a history of hypertension, type 2 diabetes, hyperlipidemia who presents today for follow up of diabetes and to discuss right sided sciatic pain.  1) Type 2 Diabetes:  Current medications include: Metformin 1000 mg BID  She is checking her blood glucose 0 times daily.  Last A1C: 6.9 in February 2022, 6.5 today Last Eye Exam: UTD Last Foot Exam: Due Pneumonia Vaccination: Never completed, declines  Urine Microalbumin: None. Managed on ARB. Statin: Crestor  Dietary changes since last visit: None. She hasn't seen a nutritionist in years.   Exercise: None.   2) Right Lower Extremity Pain: Sciatic type pain to right buttocks with radiation down to right lower extremity x 2 weeks. She denies trauma/injury, weakness, changes in bowel/bladder.   Overall she's about the same. She's taken Ibuprofen without improvement. Sitting makes symptoms worse, she's noticed improvement with walking and has been walking on her treadmill several times weekly.   BP: 128/82       Review of Systems  Respiratory:  Negative for shortness of breath.   Cardiovascular:  Negative for chest pain.  Musculoskeletal:  Positive for back pain.  Neurological:  Negative for weakness and numbness.        Past Medical History:  Diagnosis Date   Asthma    Chicken pox    Diabetes mellitus without complication ([place])    boderline   Hyperlipidemia    Hypertension     Social History   Socioeconomic History   Marital status: Married    Spouse name: Not on file   Number of children: Not on file   Years of education: Not on file   Highest education level: Not on file  Occupational History   Not on file  Tobacco Use   Smoking status: Never   Smokeless tobacco: Never  Substance and Sexual Activity   Alcohol use: Yes    Alcohol/week: 0.0 standard drinks     Comment: rarely   Drug use: Not on file   Sexual activity: Not on file  Other Topics Concern   Not on file  Social History Narrative   Married.   No children.   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   Enjoys reading, traveling, movies.   Social Determinants of Health   Financial Resource Strain: Not on file  Food Insecurity: Not on file  Transportation Needs: Not on file  Physical Activity: Not on file  Stress: Not on file  Social Connections: Not on file  Intimate Partner Violence: Not on file    Past Surgical History:  Procedure Laterality Date   POLYPECTOMY     Uterine    Family History  Problem Relation Age of Onset   Cancer Mother        breast   Breast cancer Mother 13   Heart disease Father    Hypertension Father    Heart disease Maternal Grandfather    Hypertension Maternal Grandfather    Diabetes Maternal Grandfather     No Known Allergies  Current Outpatient Medications on File Prior to Visit  Medication Sig Dispense Refill   albuterol (PROAIR HFA) 108 (90 Base) MCG/ACT inhaler Inhale 2 puffs into the lungs every 6 (six) hours as needed for wheezing or shortness of breath. 1 Inhaler 0  amLODipine (NORVASC) 5 MG tablet Take 1 tablet (5 mg total) by mouth daily. For blood pressure. 90 tablet 2   DUREZOL 0.05 % EMUL Place 1 drop into the left eye 2 (two) times daily.     hydrochlorothiazide (HYDRODIURIL) 12.5 MG tablet Take 2 tablets (25 mg total) by mouth daily. For blood pressure. 180 tablet 3   losartan (COZAAR) 100 MG tablet TAKE 1 TABLET BY MOUTH EVERY DAY FOR BLOOD PRESSURE 90 tablet 1   metFORMIN (GLUCOPHAGE) 1000 MG tablet TAKE 1 TABLET (1,000 MG TOTAL) BY MOUTH 2 (TWO) TIMES DAILY WITH A MEAL. FOR DIABETES. 180 tablet 2   rosuvastatin (CRESTOR) 10 MG tablet TAKE 1 TABLET (10 MG TOTAL) BY MOUTH EVERY EVENING. FOR CHOLESTEROL. 90 tablet 2   sertraline (ZOLOFT) 100 MG tablet Take 1 tablet (100 mg total) by mouth daily.  For anxiety 90 tablet 1   valACYclovir (VALTREX) 500 MG tablet Take 500 mg by mouth daily.     XIIDRA 5 % SOLN      [DISCONTINUED] simvastatin (ZOCOR) 20 MG tablet TAKE 1 TABLET BY MOUTH EVERY DAY IN THE EVENING 90 tablet 1   No current facility-administered medications on file prior to visit.    BP 128/82   Pulse 76   Temp 97.7 F (36.5 C) (Temporal)   Ht 5' (1.524 m)   Wt 240 lb (108.9 kg)   SpO2 99%   BMI 46.87 kg/m  Objective:   Physical Exam Cardiovascular:     Rate and Rhythm: Normal rate and regular rhythm.  Pulmonary:     Effort: Pulmonary effort is normal.     Breath sounds: Normal breath sounds.  Musculoskeletal:     Cervical back: Neck supple.       Back:       Legs:     Comments: 5/5 strength bilaterally Negative straight leg raise bilaterally   Skin:    General: Skin is warm and dry.          Assessment & Plan:      This visit occurred during the SARS-CoV-2 public health emergency.  Safety protocols were in place, including screening questions prior to the visit, additional usage of staff PPE, and extensive cleaning of exam room while observing appropriate contact time as indicated for disinfecting solutions.

## 2020-12-09 NOTE — Patient Instructions (Signed)
Continue taking Metformin for diabetes.  Start prednisone tablets for the sciatica pain. Take two tablets my mouth once daily for four days, then one tablet once daily for four days.   We will see you in February!  It was a pleasure to see you today!

## 2020-12-09 NOTE — Assessment & Plan Note (Signed)
Improved with A1C today of 6.5!  Continue Metformin 1000 mg BID. Eye exam UTD. Managed on statin and ACE-I. Foot exam today. Declines pneumonia vaccine.  Follow up in 6 months.

## 2020-12-09 NOTE — Assessment & Plan Note (Signed)
Appears secondary to piriformis irritation. No alarm signs on exam.  Rx for prednisone course sent to pharmacy. She will watch glucose readings.

## 2020-12-09 NOTE — Progress Notes (Signed)
67

## 2020-12-11 ENCOUNTER — Other Ambulatory Visit: Payer: Self-pay | Admitting: Primary Care

## 2020-12-11 DIAGNOSIS — F411 Generalized anxiety disorder: Secondary | ICD-10-CM

## 2020-12-12 NOTE — Telephone Encounter (Signed)
Pt seen in office recently and has CPE appointment refill sent in to last until that appointment.

## 2021-01-14 ENCOUNTER — Other Ambulatory Visit: Payer: Self-pay | Admitting: Primary Care

## 2021-01-14 DIAGNOSIS — Z1231 Encounter for screening mammogram for malignant neoplasm of breast: Secondary | ICD-10-CM

## 2021-01-26 ENCOUNTER — Other Ambulatory Visit: Payer: Self-pay | Admitting: Primary Care

## 2021-01-26 DIAGNOSIS — I10 Essential (primary) hypertension: Secondary | ICD-10-CM

## 2021-03-11 ENCOUNTER — Ambulatory Visit
Admission: RE | Admit: 2021-03-11 | Discharge: 2021-03-11 | Disposition: A | Discharge status: Home / Self Care | Payer: BC Managed Care – PPO | Source: Ambulatory Visit | Attending: Primary Care | Admitting: Primary Care

## 2021-03-11 ENCOUNTER — Other Ambulatory Visit: Payer: Self-pay

## 2021-03-11 DIAGNOSIS — Z1231 Encounter for screening mammogram for malignant neoplasm of breast: Secondary | ICD-10-CM | POA: Insufficient documentation

## 2021-03-12 ENCOUNTER — Other Ambulatory Visit: Payer: Self-pay | Admitting: Internal Medicine

## 2021-03-12 ENCOUNTER — Other Ambulatory Visit: Payer: Self-pay | Admitting: Primary Care

## 2021-03-12 DIAGNOSIS — R928 Other abnormal and inconclusive findings on diagnostic imaging of breast: Secondary | ICD-10-CM

## 2021-03-12 DIAGNOSIS — N6489 Other specified disorders of breast: Secondary | ICD-10-CM

## 2021-03-30 ENCOUNTER — Other Ambulatory Visit: Payer: Self-pay

## 2021-03-30 ENCOUNTER — Ambulatory Visit
Admission: RE | Admit: 2021-03-30 | Discharge: 2021-03-30 | Disposition: A | Discharge status: Home / Self Care | Payer: BC Managed Care – PPO | Source: Ambulatory Visit | Attending: Primary Care | Admitting: Primary Care

## 2021-03-30 DIAGNOSIS — N6489 Other specified disorders of breast: Secondary | ICD-10-CM

## 2021-03-30 DIAGNOSIS — R928 Other abnormal and inconclusive findings on diagnostic imaging of breast: Secondary | ICD-10-CM | POA: Insufficient documentation

## 2021-03-31 ENCOUNTER — Other Ambulatory Visit: Payer: Self-pay | Admitting: Primary Care

## 2021-03-31 DIAGNOSIS — R928 Other abnormal and inconclusive findings on diagnostic imaging of breast: Secondary | ICD-10-CM

## 2021-03-31 DIAGNOSIS — N6042 Mammary duct ectasia of left breast: Secondary | ICD-10-CM

## 2021-03-31 DIAGNOSIS — N6320 Unspecified lump in the left breast, unspecified quadrant: Secondary | ICD-10-CM

## 2021-04-09 ENCOUNTER — Ambulatory Visit
Admission: RE | Admit: 2021-04-09 | Discharge: 2021-04-09 | Disposition: A | Discharge status: Home / Self Care | Payer: BC Managed Care – PPO | Source: Ambulatory Visit | Attending: Primary Care | Admitting: Primary Care

## 2021-04-09 ENCOUNTER — Other Ambulatory Visit: Payer: Self-pay

## 2021-04-09 DIAGNOSIS — R928 Other abnormal and inconclusive findings on diagnostic imaging of breast: Secondary | ICD-10-CM | POA: Insufficient documentation

## 2021-04-09 DIAGNOSIS — N6320 Unspecified lump in the left breast, unspecified quadrant: Secondary | ICD-10-CM | POA: Insufficient documentation

## 2021-04-09 DIAGNOSIS — N6042 Mammary duct ectasia of left breast: Secondary | ICD-10-CM | POA: Insufficient documentation

## 2021-04-10 LAB — SURGICAL PATHOLOGY

## 2021-04-13 ENCOUNTER — Encounter: Payer: Self-pay | Admitting: *Deleted

## 2021-04-13 NOTE — Progress Notes (Signed)
Received message from [place], RN at [facility] that patient had been notified of her benign biopsy, but need for surgical consult.  I have spoken with the patient to establish navigation services.  I have scheduled her to see Dr. Christian Mate at [place] tomorrow at 10:45.

## 2021-04-14 ENCOUNTER — Encounter: Payer: Self-pay | Admitting: Surgery

## 2021-04-14 ENCOUNTER — Other Ambulatory Visit: Payer: Self-pay

## 2021-04-14 ENCOUNTER — Other Ambulatory Visit: Payer: Self-pay | Admitting: Surgery

## 2021-04-14 ENCOUNTER — Ambulatory Visit: Payer: BC Managed Care – PPO | Admitting: Surgery

## 2021-04-14 VITALS — BP 142/82 | HR 83 | Temp 98.9°F | Ht 60.0 in | Wt 235.6 lb

## 2021-04-14 DIAGNOSIS — D242 Benign neoplasm of left breast: Secondary | ICD-10-CM

## 2021-04-14 NOTE — Patient Instructions (Signed)
Our surgery scheduler will call you within 24-48 hours to schedule your surgery. Please have the blue surgery sheet available when speaking with her.    Lumpectomy A lumpectomy, sometimes called a partial mastectomy, is surgery to remove a cancerous tumor or mass (the lump) from a breast. It is a form of breast-conserving or breast-preservation surgery. This means that the cancerous tissue is removed but the breast remains intact. During a lumpectomy, the portion of the breast that contains the tumor is removed. Some normal tissue around the lump may be taken out to make sure that all of the tumor has been removed. Lymph nodes under your arm may also be removed and tested to find out if the cancer has spread. Lymph nodes are part of the body's disease-fighting system (immune system) and are usually the first place where breast cancer spreads. Tell a health care provider about: Any allergies you have. All medicines you are taking, including vitamins, herbs, eye drops, creams, and over-the-counter medicines. Any problems you or family members have had with anesthetic medicines. Any blood disorders you have. Any surgeries you have had. Any medical conditions you have. Whether you are pregnant or may be pregnant. What are the risks? Generally, this is a safe procedure. However, problems may occur, including: Bleeding. Infection. Allergic reaction to medicines. Pain, swelling, weakness, or numbness in the arm on the side of your surgery. Temporary swelling. Change in the shape of the breast, particularly if a large portion is removed. Scar tissue that forms at the surgical site and feels hard to the touch. Blood clots. What happens before the procedure? Staying hydrated Follow instructions from your health care provider about hydration, which may include: Up to 2 hours before the procedure - you may continue to drink clear liquids, such as water, clear fruit juice, black coffee, and plain  tea.  Eating and drinking restrictions Follow instructions from your health care provider about eating and drinking, which may include: 8 hours before the procedure - stop eating heavy meals or foods, such as meat, fried foods, or fatty foods. 6 hours before the procedure - stop eating light meals or foods, such as toast or cereal. 6 hours before the procedure - stop drinking milk or drinks that contain milk. 2 hours before the procedure - stop drinking clear liquids. Medicines Ask your health care provider about: Changing or stopping your regular medicines. This is especially important if you are taking diabetes medicines or blood thinners. Taking medicines such as aspirin and ibuprofen. These medicines can thin your blood. Do not take these medicines unless your health care provider tells you to take them. Taking over-the-counter medicines, vitamins, herbs, and supplements. General instructions Prior to surgery, your health care provider may do a procedure to locate and mark the tumor area in your breast (localization). This will help guide your surgeon to where the incision will be made. This may be done with: Imaging, such as a mammogram, ultrasound, or MRI. Insertion of a small wire, clip, or seed, or an implant that will reflect a radar signal. You may have screening tests or exams to get baseline measurements of your arm. These can be compared to measurements done after surgery to monitor for swelling (lymphedema) that can develop after having lymph nodes removed. Ask your health care provider: How your surgery site will be marked. What steps will be taken to help prevent infection. These may include: Washing skin with a germ-killing soap. Taking antibiotic medicine. Plan to have someone take you  home from the hospital or clinic. Plan to have a responsible adult care for you for at least 24 hours after you leave the hospital or clinic. This is important. What happens during the  procedure?  An IV will be inserted into one of your veins. You will be given one or more of the following: A medicine to help you relax (sedative). A medicine to numb the area (local anesthetic). A medicine to make you fall asleep (general anesthetic). Your health care provider will use a kind of electric scalpel that uses heat to reduce bleeding (electrocautery knife). A curved incision that follows the natural curve of your breast will be made. This type of incision will allow for minimal scarring and better healing. The tumor will be removed along with some of the tissue around it. This will be sent to the lab for testing. Your health care provider may also remove lymph nodes at this time if needed. If the tumor is close to the muscles over your chest, some muscle tissue may also be removed. A small drain tube may be inserted into your breast area or armpit to collect fluid that may build up after surgery. This tube will be connected to a suction bulb on the outside of your body to remove the fluid. The incision will be closed with stitches (sutures). A bandage (dressing) may be placed over the incision. The procedure may vary among health care providers and hospitals. What happens after the procedure? Your blood pressure, heart rate, breathing rate, and blood oxygen level will be monitored until you leave the hospital or clinic. You will be given medicine for pain as needed. Your IV will be removed when you are able to eat and drink by mouth. You will be encouraged to get up and walk as soon as you can. This is important to improve blood flow and breathing. Ask for help if you feel weak or unsteady. You may have: A drain tube in place for 2-3 days to prevent a collection of blood (hematoma) from developing in the breast. You will be given instructions about caring for the drain before you go home. A pressure bandage applied for 1-2 days to prevent bleeding or swelling. Your pressure bandage  may look like a thick piece of fabric or an elastic wrap. Ask your health care provider how to care for your bandage at home. You may be given a tight sleeve to wear over your arm on the side of your surgery. You should wear this sleeve as told by your health care provider. Do not drive for 24 hours if you were given a sedative during your procedure. Summary A lumpectomy, sometimes called a partial mastectomy, is surgery to remove a cancerous tumor or mass (the lump) from a breast. During a lumpectomy, the portion of the breast that contains the tumor is removed. Lymph nodes under your arm may also be removed and tested to find out if the cancer has spread. Plan to have someone take you home from the hospital or clinic. You may have a drain tube in place for 2-3 days to prevent a collection of blood (hematoma) from developing in the breast. You will be given instructions about caring for the drain before you go home. This information is not intended to replace advice given to you by your health care provider. Make sure you discuss any questions you have with your health care provider. Document Revised: 10/23/2018 Document Reviewed: 10/23/2018 Elsevier Patient Education  [place].

## 2021-04-14 NOTE — Progress Notes (Signed)
Patient ID: Destiny West, female   DOB: Jun 13, 1970, 50 y.o.   MRN: [id number]  Chief Complaint: Ductal papilloma left breast.  History of Present Illness Destiny West is a 50 y.o. female with screening mammography and subsequent ultrasound imaging showing an intraductal lesion, this was percutaneously biopsied and confirmed to be a benign papilloma.  Plan is to proceed with complete excisional biopsy. She has not utilized birth control or hormonal therapy.  She is premenopausal.  Her mother had breast cancer in her 15s.  She began menstruating at the age of 28.  She has been pregnant once with 1 miscarriage.  She was 50 years old at the time of her first pregnancy. She has felt no breast lump, breast tenderness or nipple discharge.  She denies any history of skin changes.  She performs breast self examinations.  She does her mammograms annually.  Past Medical History Past Medical History:  Diagnosis Date   Asthma    Chicken pox    Diabetes mellitus without complication ([place])    boderline   Hyperlipidemia    Hypertension       Past Surgical History:  Procedure Laterality Date   POLYPECTOMY     Uterine    No Known Allergies  Current Outpatient Medications  Medication Sig Dispense Refill   albuterol (PROAIR HFA) 108 (90 Base) MCG/ACT inhaler Inhale 2 puffs into the lungs every 6 (six) hours as needed for wheezing or shortness of breath. 1 Inhaler 0   amLODipine (NORVASC) 5 MG tablet Take 1 tablet (5 mg total) by mouth daily. For blood pressure. 90 tablet 2   DUREZOL 0.05 % EMUL Place 1 drop into the left eye 2 (two) times daily.     hydrochlorothiazide (HYDRODIURIL) 12.5 MG tablet Take 2 tablets (25 mg total) by mouth daily. For blood pressure. 180 tablet 3   losartan (COZAAR) 100 MG tablet TAKE 1 TABLET BY MOUTH EVERY DAY FOR BLOOD PRESSURE 90 tablet 1   metFORMIN (GLUCOPHAGE) 1000 MG tablet TAKE 1 TABLET (1,000 MG TOTAL) BY MOUTH 2 (TWO) TIMES DAILY WITH A MEAL. FOR DIABETES. 180  tablet 2   rosuvastatin (CRESTOR) 10 MG tablet TAKE 1 TABLET (10 MG TOTAL) BY MOUTH EVERY EVENING. FOR CHOLESTEROL. 90 tablet 2   sertraline (ZOLOFT) 100 MG tablet TAKE 1 TABLET (100 MG TOTAL) BY MOUTH DAILY. FOR ANXIETY 90 tablet 1   valACYclovir (VALTREX) 500 MG tablet Take 500 mg by mouth daily.     No current facility-administered medications for this visit.    Family History Family History  Problem Relation Age of Onset   Cancer Mother        breast   Breast cancer Mother 96   Heart disease Father    Hypertension Father    Heart disease Maternal Grandfather    Hypertension Maternal Grandfather    Diabetes Maternal Grandfather       Social History Social History   Tobacco Use   Smoking status: Never   Smokeless tobacco: Never  Substance Use Topics   Alcohol use: Yes    Alcohol/week: 0.0 standard drinks    Comment: rarely        Review of Systems  Constitutional:  Negative for chills, diaphoresis, fever, malaise/fatigue and weight loss.  HENT:  Negative for hearing loss.   Eyes:  Positive for blurred vision (Diminished vision in left eye currently being evaluated.).  Respiratory:  Negative for cough and shortness of breath.   Cardiovascular:  Negative for chest pain and  palpitations.  Gastrointestinal:  Negative for abdominal pain, blood in stool, constipation, diarrhea, heartburn, melena, nausea and vomiting.  Genitourinary: Negative.   Skin:  Negative for itching and rash.  Neurological:  Negative for dizziness and headaches.  Psychiatric/Behavioral: Negative.       Physical Exam Blood pressure (!) 142/82, pulse 83, temperature 98.9 F (37.2 C), temperature source Oral, height 5' (1.524 m), weight 235 lb 9.6 oz (106.9 kg), SpO2 98 %. Last Weight  Most recent update: 04/14/2021 10:38 AM    Weight  106.9 kg (235 lb 9.6 oz)             CONSTITUTIONAL: Well developed, and nourished, appropriately responsive and aware without distress.   EYES: Sclera  non-icteric.   EARS, NOSE, MOUTH AND THROAT: Mask worn.   Hearing is intact to voice.  NECK: Trachea is midline, and there is no jugular venous distension.  LYMPH NODES:  Lymph nodal enlargement in the neck are not noted. RESPIRATORY:  Lungs are clear, and breath sounds are equal bilaterally. Normal respiratory effort without pathologic use of accessory muscles. CARDIOVASCULAR: Heart is regular in rate and rhythm. MUSCULOSKELETAL: No obvious defects noted in her four extremities.    SKIN: Skin turgor is normal. No pathologic skin lesions appreciated.  NEUROLOGIC:  Motor and sensation appear grossly normal.  Cranial nerves are grossly without defect. PSYCH:  Alert and oriented to person, place and time. Affect is appropriate for situation.  Data Reviewed I have personally reviewed what is currently available of the patient's imaging, recent labs and medical records.   Labs:  CBC Latest Ref Rng & Units 06/06/2019 12/02/2016 05/01/2015  WBC 4.0 - 10.5 K/uL 11.4(H) 10.4 10.6(H)  Hemoglobin 12.0 - 15.0 g/dL 13.0 13.7 13.5  Hematocrit 36.0 - 46.0 % 39.5 42.0 40.9  Platelets 150.0 - 400.0 K/uL 393.0 334.0 353.0   CMP Latest Ref Rng & Units 06/09/2020 06/06/2019 05/30/2018  Glucose 70 - 99 mg/dL 137(H) 152(H) 175(H)  BUN 6 - 23 mg/dL 13 13 15   Creatinine 0.40 - 1.20 mg/dL 0.73 0.76 0.78  Sodium 135 - 145 mEq/L 139 139 140  Potassium 3.5 - 5.1 mEq/L 3.9 3.5 4.2  Chloride 96 - 112 mEq/L 102 99 100  CO2 19 - 32 mEq/L 27 28 29   Calcium 8.4 - 10.5 mg/dL 9.6 9.8 10.1  Total Protein 6.0 - 8.3 g/dL 7.1 7.4 7.4  Total Bilirubin 0.2 - 1.2 mg/dL 0.3 0.4 0.4  Alkaline Phos 39 - 117 U/L 36(L) 43 39  AST 0 - 37 U/L 19 20 26   ALT 0 - 35 U/L 18 29 34   SURGICAL PATHOLOGY  CASE: [id number]  PATIENT: [name]  Surgical Pathology Report   Specimen Submitted:  A. Breast, left   Clinical History: Intraductal mass vs debris.  Papilloma vs duct  ectasia. Venus-shaped clip placed following ultrasound guided  biopsy of  LEFT breast lower outer quadrant.   DIAGNOSIS:  A. BREAST, LEFT AT 3:00, 4 CM FROM THE NIPPLE; ULTRASOUND-GUIDED CORE  NEEDLE BIOPSY:  - FRAGMENTS OF BENIGN, PARTIALLY SCLEROSED INTRADUCTAL PAPILLOMA WITH  ASSOCIATED USUAL DUCTAL HYPERPLASIA AND APOCRINE METAPLASIA.  - NO DEFINITE EVIDENCE OF ATYPICAL PROLIFERATIVE BREAST DISEASE IN THE  CURRENT SAMPLE.   Comment:  Excision is recommended for definitive classification, and exclusion of  potentially unsampled atypia.    Imaging: CLINICAL DATA:  Left breast focal asymmetry seen on most recent screening mammography.   EXAM: DIGITAL DIAGNOSTIC UNILATERAL LEFT MAMMOGRAM WITH TOMOSYNTHESIS AND CAD; ULTRASOUND LEFT  BREAST LIMITED   TECHNIQUE: Left digital diagnostic mammography and breast tomosynthesis was performed. The images were evaluated with computer-aided detection.; Targeted ultrasound examination of the left breast was performed.   COMPARISON:  Previous exam(s).   ACR Breast Density Category b: There are scattered areas of fibroglandular density.   FINDINGS: Additional mammographic views of the left breast demonstrate a linear focal asymmetry, favored to represent a dilated duct in the lower outer left breast, anterior to middle depth.   Targeted left breast ultrasound is performed demonstrating a single branching dilated duct in the left 3 o'clock breast 4 cm from the nipple. This finding corresponds to the mammographically seen abnormality. Within the middle portion of the dilated duct there is an intraductal mass versus debris which measures 0.5 by 0.4 by 1.1 cm. There is no evidence of left axillary lymphadenopathy.   IMPRESSION: Left breast 3 o'clock duct ectasia with probable intraductal mass, for which ultrasound-guided core needle biopsy is recommended.   RECOMMENDATION: Ultrasound-guided core needle biopsy of the left breast.   Further evaluation with breast MRI may also be considered,  following the ultrasound-guided biopsy.   I have discussed the findings and recommendations with the patient. If applicable, a reminder letter will be sent to the patient regarding the next appointment.   BI-RADS CATEGORY  4: Suspicious.     Electronically Signed   By: Fidela Salisbury M.D.   On: 03/30/2021 15:09 Within last 24 hrs: No results found.  Assessment    Intraductal papilloma left breast. Patient Active Problem List   Diagnosis Date Noted   Sciatica without back pain, right 12/09/2020   Herpes zoster ophthalmicus of left eye 06/11/2020   Screening for cervical cancer 11/30/2018   Morbid obesity with BMI of 45.0-49.9, adult ([place]) 06/03/2017   Preventative health care 05/09/2015   Type 2 diabetes mellitus ([place]) 11/26/2014   Essential hypertension 11/26/2014   Hyperlipidemia 11/26/2014   GAD (generalized anxiety disorder) 11/26/2014   Asthma in adult without complication 16/96/7893    Plan    RF ID tag localized left breast excisional biopsy. We reviewed the need for an additional radiologically placed tag for removal during surgery.  Risks of the procedure include but are not limited to anesthesia, bleeding/bruising, infection, scarring, increase in or change in diagnoses.  Questions adequately answered, I believe to her content.  No guarantees were ever expressed or implied.  Face-to-face time spent with the patient and accompanying care providers(if present) was 20 minutes, with more than 50% of the time spent counseling, educating, and coordinating care of the patient.    These notes generated with voice recognition software. I apologize for typographical errors.  Ronny Bacon M.D., FACS 04/14/2021, 11:03 AM

## 2021-04-15 ENCOUNTER — Telehealth: Payer: Self-pay | Admitting: Surgery

## 2021-04-15 NOTE — Telephone Encounter (Signed)
Patient has been advised of Pre-Admission date/time, COVID Testing date and Surgery date.  Surgery Date: 05/13/21 Preadmission Testing Date: 05/05/21 (phone 8a-1p) Covid Testing Date: Not needed.    Patient has been made aware to call [phone], between 1-3:00pm the day before surgery, to find out what time to arrive for surgery.   Also patient reminded of her RF tag to be placed at the [facility] on 05/06/21.

## 2021-04-16 ENCOUNTER — Telehealth: Payer: Self-pay | Admitting: Surgery

## 2021-04-16 NOTE — Telephone Encounter (Signed)
Updated information regarding rescheduled surgery at doctor's request.    Patient has been advised of Pre-Admission date/time, COVID Testing date and Surgery date.  Surgery Date: 05/20/21 Preadmission Testing Date: 05/11/21 (phone 8a-1p) Covid Testing Date: Not needed.     Patient has been made aware to call [phone], between 1-3:00pm the day before surgery, to find out what time to arrive for surgery.    Patient also reminded of her RF tag placement at the [facility] 05/06/21

## 2021-05-05 ENCOUNTER — Other Ambulatory Visit: Payer: BC Managed Care – PPO

## 2021-05-05 ENCOUNTER — Other Ambulatory Visit: Payer: Self-pay | Admitting: Surgery

## 2021-05-05 DIAGNOSIS — D242 Benign neoplasm of left breast: Secondary | ICD-10-CM

## 2021-05-06 ENCOUNTER — Other Ambulatory Visit: Payer: Self-pay

## 2021-05-06 ENCOUNTER — Ambulatory Visit
Admission: RE | Admit: 2021-05-06 | Discharge: 2021-05-06 | Disposition: A | Discharge status: Home / Self Care | Payer: BC Managed Care – PPO | Source: Ambulatory Visit | Attending: Surgery | Admitting: Surgery

## 2021-05-06 ENCOUNTER — Ambulatory Visit: Admission: RE | Admit: 2021-05-06 | Payer: BC Managed Care – PPO | Source: Ambulatory Visit

## 2021-05-06 DIAGNOSIS — D242 Benign neoplasm of left breast: Secondary | ICD-10-CM | POA: Insufficient documentation

## 2021-05-11 ENCOUNTER — Ambulatory Visit: Payer: Self-pay | Admitting: Surgery

## 2021-05-11 ENCOUNTER — Other Ambulatory Visit: Payer: Self-pay

## 2021-05-11 ENCOUNTER — Other Ambulatory Visit
Admission: RE | Admit: 2021-05-11 | Discharge: 2021-05-11 | Disposition: A | Discharge status: Home / Self Care | Payer: BC Managed Care – PPO | Source: Ambulatory Visit | Attending: Surgery | Admitting: Surgery

## 2021-05-11 DIAGNOSIS — E785 Hyperlipidemia, unspecified: Secondary | ICD-10-CM

## 2021-05-11 DIAGNOSIS — I10 Essential (primary) hypertension: Secondary | ICD-10-CM

## 2021-05-11 DIAGNOSIS — D242 Benign neoplasm of left breast: Secondary | ICD-10-CM

## 2021-05-11 DIAGNOSIS — Z6841 Body Mass Index (BMI) 40.0 and over, adult: Secondary | ICD-10-CM

## 2021-05-11 HISTORY — DX: Anxiety disorder, unspecified: F41.9

## 2021-05-11 NOTE — Patient Instructions (Signed)
Your procedure is scheduled on: Wednesday May 20, 2021. Report to Day Surgery inside [place] 2nd floor.  To find out your arrival time please call [phone] between 1PM - 3PM on Tuesday May 19, 2021.  Remember: Instructions that are not followed completely may result in serious medical risk,  up to and including death, or upon the discretion of your surgeon and anesthesiologist your  surgery may need to be rescheduled.     _X__ 1. Do not eat food or drink fluids after midnight the night before your procedure.                 No chewing gum or hard candies.   __X__2.  On the morning of surgery brush your teeth with toothpaste and water, you                may rinse your mouth with mouthwash if you wish.  Do not swallow any toothpaste or mouthwash.     _X__ 3.  No Alcohol for 24 hours before or after surgery.   _X__ 4.  Do Not Smoke or use e-cigarettes For 24 Hours Prior to Your Surgery.                 Do not use any chewable tobacco products for at least 6 hours prior to                 Surgery.  _X__  5.  Do not use any recreational drugs (marijuana, cocaine, heroin, ecstasy, MDMA or other)                For at least one week prior to your surgery.  Combination of these drugs with anesthesia                May have life threatening results.   __X__6.  Notify your doctor if there is any change in your medical condition      (cold, fever, infections).     Do not wear jewelry, make-up, hairpins, clips or nail polish. Do not wear lotions, powders, or perfumes deodorant. Do not shave 48 hours prior to surgery. Men may shave face and neck. Do not bring valuables to the hospital.    [facility] is not responsible for any belongings or valuables.  Contacts, dentures or bridgework may not be worn into surgery. Leave your suitcase in the car. After surgery it may be brought to your room. For patients admitted to the hospital, discharge time is  determined by your treatment team.   Patients discharged the day of surgery will not be allowed to drive home.   Make arrangements for someone to be with you for the first 24 hours of your Same Day Discharge.  __X__ Take these medicines the morning of surgery with A SIP OF WATER:    1. amLODipine (NORVASC) 5 MG   2. sertraline (ZOLOFT) 100 MG  3. valACYclovir (VALTREX) 500 MG  4.  5.  6.  ____ Fleet Enema (as directed)   __X__ Use CHG Soap (or wipes) as directed  ____ Use Benzoyl Peroxide Gel as instructed  __X__ Use inhalers on the day of surgery  albuterol (PROAIR HFA) 108 (90 Base) MCG/ACT inhaler  __X__ Stop metformin 2 days prior to surgery Take last dose Sunday 05/17/21    ____ Take 1/2 of usual insulin dose the night before surgery. No insulin the morning          of surgery.  ____ Call your PCP, cardiologist, or Pulmonologist if taking Coumadin/Plavix/aspirin and ask when to stop before your surgery.   __X__ One Week prior to surgery- Stop Anti-inflammatories such as Ibuprofen, Aleve, Advil, Motrin, meloxicam (MOBIC), diclofenac, etodolac, ketorolac, Toradol, Daypro, piroxicam, Goody's or BC powders. OK TO USE TYLENOL IF NEEDED   __X__ Stop supplements until after surgery.  Omega-3 Fatty Acids (FISH OIL) 1000 MG    ____ Bring C-Pap to the hospital.    If you have any questions regarding your pre-procedure instructions,  Please call Pre-admit Testing at [phone]

## 2021-05-12 ENCOUNTER — Other Ambulatory Visit
Admission: RE | Admit: 2021-05-12 | Discharge: 2021-05-12 | Disposition: A | Discharge status: Home / Self Care | Payer: BC Managed Care – PPO | Source: Ambulatory Visit | Attending: Surgery | Admitting: Surgery

## 2021-05-12 DIAGNOSIS — Z01818 Encounter for other preprocedural examination: Secondary | ICD-10-CM | POA: Diagnosis present

## 2021-05-12 DIAGNOSIS — Z6841 Body Mass Index (BMI) 40.0 and over, adult: Secondary | ICD-10-CM | POA: Diagnosis not present

## 2021-05-12 DIAGNOSIS — E785 Hyperlipidemia, unspecified: Secondary | ICD-10-CM | POA: Insufficient documentation

## 2021-05-12 DIAGNOSIS — I10 Essential (primary) hypertension: Secondary | ICD-10-CM | POA: Insufficient documentation

## 2021-05-12 LAB — CBC
HCT: 39.3 % (ref 36.0–46.0)
Hemoglobin: 12.6 g/dL (ref 12.0–15.0)
MCH: 25.9 pg — ABNORMAL LOW (ref 26.0–34.0)
MCHC: 32.1 g/dL (ref 30.0–36.0)
MCV: 80.7 fL (ref 80.0–100.0)
Platelets: 316 10*3/uL (ref 150–400)
RBC: 4.87 MIL/uL (ref 3.87–5.11)
RDW: 14.2 % (ref 11.5–15.5)
WBC: 11.8 10*3/uL — ABNORMAL HIGH (ref 4.0–10.5)
nRBC: 0 % (ref 0.0–0.2)

## 2021-05-12 LAB — BASIC METABOLIC PANEL
Anion gap: 9 (ref 5–15)
BUN: 12 mg/dL (ref 6–20)
CO2: 27 mmol/L (ref 22–32)
Calcium: 9.1 mg/dL (ref 8.9–10.3)
Chloride: 102 mmol/L (ref 98–111)
Creatinine, Ser: 0.57 mg/dL (ref 0.44–1.00)
GFR, Estimated: >60 mL/min (ref >60)
Glucose, Bld: 111 mg/dL — ABNORMAL HIGH (ref 70–99)
Potassium: 3.6 mmol/L (ref 3.5–5.1)
Sodium: 138 mmol/L (ref 135–145)

## 2021-05-13 ENCOUNTER — Ambulatory Visit: Payer: BC Managed Care – PPO

## 2021-05-18 ENCOUNTER — Other Ambulatory Visit: Payer: Self-pay | Admitting: Primary Care

## 2021-05-18 DIAGNOSIS — E785 Hyperlipidemia, unspecified: Secondary | ICD-10-CM

## 2021-05-18 DIAGNOSIS — I10 Essential (primary) hypertension: Secondary | ICD-10-CM

## 2021-05-18 DIAGNOSIS — E119 Type 2 diabetes mellitus without complications: Secondary | ICD-10-CM

## 2021-05-19 MED ORDER — GABAPENTIN 300 MG PO CAPS: 300.0000 mg | ORAL_CAPSULE | ORAL | Status: AC

## 2021-05-19 MED ORDER — FAMOTIDINE 20 MG PO TABS: 20.0000 mg | ORAL_TABLET | Freq: Once | ORAL | Status: AC

## 2021-05-19 MED ORDER — CHLORHEXIDINE GLUCONATE CLOTH 2 % EX PADS: 6.0000 | MEDICATED_PAD | Freq: Once | CUTANEOUS | Status: DC

## 2021-05-19 MED ORDER — SODIUM CHLORIDE 0.9 % IV SOLN: INTRAVENOUS | Status: DC

## 2021-05-19 MED ORDER — CHLORHEXIDINE GLUCONATE 0.12 % MT SOLN: 15.0000 mL | Freq: Once | OROMUCOSAL | Status: AC

## 2021-05-19 MED ORDER — ACETAMINOPHEN 500 MG PO TABS: 1000.0000 mg | ORAL_TABLET | ORAL | Status: AC

## 2021-05-19 MED ORDER — CHLORHEXIDINE GLUCONATE CLOTH 2 % EX PADS
6.0000 | MEDICATED_PAD | Freq: Once | CUTANEOUS | Status: AC
  Administered 2021-05-20: 6 via TOPICAL

## 2021-05-19 MED ORDER — ORAL CARE MOUTH RINSE: 15.0000 mL | Freq: Once | OROMUCOSAL | Status: AC

## 2021-05-19 MED ORDER — CEFAZOLIN SODIUM-DEXTROSE 2-4 GM/100ML-% IV SOLN
2.0000 g | INTRAVENOUS | Status: AC
  Administered 2021-05-20: 2 g via INTRAVENOUS

## 2021-05-20 ENCOUNTER — Ambulatory Visit: Payer: BC Managed Care – PPO | Admitting: Anesthesiology

## 2021-05-20 ENCOUNTER — Encounter
Admission: RE | Disposition: A | Discharge status: Home / Self Care | Payer: Self-pay | Source: Home / Self Care | Attending: Surgery

## 2021-05-20 ENCOUNTER — Other Ambulatory Visit: Payer: Self-pay

## 2021-05-20 ENCOUNTER — Ambulatory Visit
Admission: RE | Admit: 2021-05-20 | Discharge: 2021-05-20 | Disposition: A | Discharge status: Home / Self Care | Payer: BC Managed Care – PPO | Source: Ambulatory Visit | Attending: Surgery | Admitting: Surgery

## 2021-05-20 ENCOUNTER — Encounter: Payer: Self-pay | Admitting: Surgery

## 2021-05-20 ENCOUNTER — Ambulatory Visit
Admission: RE | Admit: 2021-05-20 | Discharge: 2021-05-20 | Disposition: A | Discharge status: Home / Self Care | Payer: BC Managed Care – PPO | Attending: Surgery | Admitting: Surgery

## 2021-05-20 DIAGNOSIS — I10 Essential (primary) hypertension: Secondary | ICD-10-CM | POA: Insufficient documentation

## 2021-05-20 DIAGNOSIS — J45909 Unspecified asthma, uncomplicated: Secondary | ICD-10-CM | POA: Insufficient documentation

## 2021-05-20 DIAGNOSIS — E785 Hyperlipidemia, unspecified: Secondary | ICD-10-CM | POA: Diagnosis not present

## 2021-05-20 DIAGNOSIS — Z79899 Other long term (current) drug therapy: Secondary | ICD-10-CM | POA: Insufficient documentation

## 2021-05-20 DIAGNOSIS — Z803 Family history of malignant neoplasm of breast: Secondary | ICD-10-CM | POA: Insufficient documentation

## 2021-05-20 DIAGNOSIS — E119 Type 2 diabetes mellitus without complications: Secondary | ICD-10-CM | POA: Diagnosis not present

## 2021-05-20 DIAGNOSIS — D242 Benign neoplasm of left breast: Secondary | ICD-10-CM | POA: Diagnosis not present

## 2021-05-20 DIAGNOSIS — Z7984 Long term (current) use of oral hypoglycemic drugs: Secondary | ICD-10-CM | POA: Diagnosis not present

## 2021-05-20 HISTORY — PX: BREAST LUMPECTOMY WITH RADIO FREQUENCY LOCALIZER: SHX6897

## 2021-05-20 LAB — COMPREHENSIVE METABOLIC PANEL
ALT: 22 U/L (ref 0–44)
AST: 26 U/L (ref 15–41)
Albumin: 4.1 g/dL (ref 3.5–5.0)
Alkaline Phosphatase: 40 U/L (ref 38–126)
Anion gap: 7 (ref 5–15)
BUN: 12 mg/dL (ref 6–20)
CO2: 25 mmol/L (ref 22–32)
Calcium: 9 mg/dL (ref 8.9–10.3)
Chloride: 106 mmol/L (ref 98–111)
Creatinine, Ser: 0.55 mg/dL (ref 0.44–1.00)
GFR, Estimated: >60 mL/min (ref >60)
Glucose, Bld: 139 mg/dL — ABNORMAL HIGH (ref 70–99)
Potassium: 3.7 mmol/L (ref 3.5–5.1)
Sodium: 138 mmol/L (ref 135–145)
Total Bilirubin: 0.4 mg/dL (ref 0.3–1.2)
Total Protein: 7.5 g/dL (ref 6.5–8.1)

## 2021-05-20 LAB — CBC WITH DIFFERENTIAL/PLATELET
Abs Immature Granulocytes: 0.05 10*3/uL (ref 0.00–0.07)
Basophils Absolute: 0.1 10*3/uL (ref 0.0–0.1)
Basophils Relative: 1 %
Eosinophils Absolute: 0.3 10*3/uL (ref 0.0–0.5)
Eosinophils Relative: 3 %
HCT: 41.3 % (ref 36.0–46.0)
Hemoglobin: 13.2 g/dL (ref 12.0–15.0)
Immature Granulocytes: 1 %
Lymphocytes Relative: 24 %
Lymphs Abs: 2.5 10*3/uL (ref 0.7–4.0)
MCH: 25.7 pg — ABNORMAL LOW (ref 26.0–34.0)
MCHC: 32 g/dL (ref 30.0–36.0)
MCV: 80.5 fL (ref 80.0–100.0)
Monocytes Absolute: 0.6 10*3/uL (ref 0.1–1.0)
Monocytes Relative: 5 %
Neutro Abs: 7.1 10*3/uL (ref 1.7–7.7)
Neutrophils Relative %: 66 %
Platelets: 355 10*3/uL (ref 150–400)
RBC: 5.13 MIL/uL — ABNORMAL HIGH (ref 3.87–5.11)
RDW: 13.8 % (ref 11.5–15.5)
WBC: 10.5 10*3/uL (ref 4.0–10.5)
nRBC: 0 % (ref 0.0–0.2)

## 2021-05-20 LAB — GLUCOSE, CAPILLARY
Glucose-Capillary: 131 mg/dL — ABNORMAL HIGH (ref 70–99)
Glucose-Capillary: 126 mg/dL — ABNORMAL HIGH (ref 70–99)

## 2021-05-20 LAB — POCT PREGNANCY, URINE: Preg Test, Ur: NEGATIVE

## 2021-05-20 SURGERY — BREAST LUMPECTOMY WITH RADIO FREQUENCY LOCALIZER
Anesthesia: General | Laterality: Left

## 2021-05-20 MED ORDER — CHLORHEXIDINE GLUCONATE 0.12 % MT SOLN
OROMUCOSAL | Status: AC
  Administered 2021-05-20: 15 mL via OROMUCOSAL
  Filled 2021-05-20: qty 15

## 2021-05-20 MED ORDER — HYDROCODONE-ACETAMINOPHEN 5-325 MG PO TABS: 1.0000 | ORAL_TABLET | Freq: Four times a day (QID) | ORAL | 0 refills | Status: DC | PRN

## 2021-05-20 MED ORDER — ACETAMINOPHEN 500 MG PO TABS
ORAL_TABLET | ORAL | Status: AC
  Administered 2021-05-20: 1000 mg via ORAL
  Filled 2021-05-20: qty 2

## 2021-05-20 MED ORDER — PROPOFOL 10 MG/ML IV BOLUS
INTRAVENOUS | Status: DC | PRN
  Administered 2021-05-20: 150 mg via INTRAVENOUS

## 2021-05-20 MED ORDER — BUPIVACAINE-EPINEPHRINE (PF) 0.25% -1:200000 IJ SOLN
INTRAMUSCULAR | Status: DC | PRN
  Administered 2021-05-20: 8 mL

## 2021-05-20 MED ORDER — DEXAMETHASONE SODIUM PHOSPHATE 10 MG/ML IJ SOLN
INTRAMUSCULAR | Status: DC | PRN
  Administered 2021-05-20: 10 mg via INTRAVENOUS

## 2021-05-20 MED ORDER — PROPOFOL 10 MG/ML IV BOLUS
INTRAVENOUS | Status: AC
  Filled 2021-05-20: qty 20

## 2021-05-20 MED ORDER — MIDAZOLAM HCL 2 MG/2ML IJ SOLN
INTRAMUSCULAR | Status: DC | PRN
  Administered 2021-05-20: 2 mg via INTRAVENOUS

## 2021-05-20 MED ORDER — FENTANYL CITRATE (PF) 100 MCG/2ML IJ SOLN
INTRAMUSCULAR | Status: AC
  Filled 2021-05-20: qty 2

## 2021-05-20 MED ORDER — GABAPENTIN 300 MG PO CAPS
ORAL_CAPSULE | ORAL | Status: AC
  Administered 2021-05-20: 300 mg via ORAL
  Filled 2021-05-20: qty 1

## 2021-05-20 MED ORDER — OXYCODONE HCL 5 MG/5ML PO SOLN: 5.0000 mg | Freq: Once | ORAL | Status: DC | PRN

## 2021-05-20 MED ORDER — BUPIVACAINE-EPINEPHRINE (PF) 0.25% -1:200000 IJ SOLN
INTRAMUSCULAR | Status: AC
  Filled 2021-05-20: qty 30

## 2021-05-20 MED ORDER — LIDOCAINE HCL (CARDIAC) PF 100 MG/5ML IV SOSY
PREFILLED_SYRINGE | INTRAVENOUS | Status: DC | PRN
  Administered 2021-05-20: 50 mg via INTRAVENOUS

## 2021-05-20 MED ORDER — OXYCODONE HCL 5 MG PO TABS: 5.0000 mg | ORAL_TABLET | Freq: Once | ORAL | Status: DC | PRN

## 2021-05-20 MED ORDER — KETOROLAC TROMETHAMINE 30 MG/ML IJ SOLN
INTRAMUSCULAR | Status: DC | PRN
  Administered 2021-05-20: 30 mg via INTRAVENOUS

## 2021-05-20 MED ORDER — FAMOTIDINE 20 MG PO TABS
ORAL_TABLET | ORAL | Status: AC
  Administered 2021-05-20: 20 mg via ORAL
  Filled 2021-05-20: qty 1

## 2021-05-20 MED ORDER — ONDANSETRON HCL 4 MG/2ML IJ SOLN
INTRAMUSCULAR | Status: DC | PRN
Start: 2021-05-20 — End: 2021-05-20
  Administered 2021-05-20: 4 mg via INTRAVENOUS

## 2021-05-20 MED ORDER — MIDAZOLAM HCL 2 MG/2ML IJ SOLN
INTRAMUSCULAR | Status: AC
  Filled 2021-05-20: qty 2

## 2021-05-20 MED ORDER — FENTANYL CITRATE (PF) 100 MCG/2ML IJ SOLN
25.0000 ug | INTRAMUSCULAR | Status: DC | PRN
  Administered 2021-05-20 (×2): 50 ug via INTRAVENOUS

## 2021-05-20 MED ORDER — CEFAZOLIN SODIUM-DEXTROSE 2-4 GM/100ML-% IV SOLN
INTRAVENOUS | Status: AC
  Filled 2021-05-20: qty 100

## 2021-05-20 SURGICAL SUPPLY — 39 items
ADH SKN CLS APL DERMABOND .7 (GAUZE/BANDAGES/DRESSINGS) ×1
APL PRP STRL LF DISP 70% ISPRP (MISCELLANEOUS) ×1
APPLIER CLIP 9.375 SM OPEN (CLIP)
APR CLP SM 9.3 20 MLT OPN (CLIP)
BLADE SURG 15 STRL LF DISP TIS (BLADE) ×1 IMPLANT
BLADE SURG 15 STRL SS (BLADE) ×2
CHLORAPREP W/TINT 26 (MISCELLANEOUS) ×2 IMPLANT
CLIP APPLIE 9.375 SM OPEN (CLIP) IMPLANT
CNTNR SPEC 2.5X3XGRAD LEK (MISCELLANEOUS)
CONT SPEC 4OZ STER OR WHT (MISCELLANEOUS)
CONT SPEC 4OZ STRL OR WHT (MISCELLANEOUS)
CONTAINER SPEC 2.5X3XGRAD LEK (MISCELLANEOUS) IMPLANT
DERMABOND ADVANCED (GAUZE/BANDAGES/DRESSINGS) ×1
DERMABOND ADVANCED .7 DNX12 (GAUZE/BANDAGES/DRESSINGS) ×1 IMPLANT
DEVICE DUBIN SPECIMEN MAMMOGRA (MISCELLANEOUS) ×2 IMPLANT
DRAPE LAPAROTOMY TRNSV 106X77 (MISCELLANEOUS) ×2 IMPLANT
ELECT CAUTERY BLADE TIP 2.5 (TIP) ×2
ELECT REM PT RETURN 9FT ADLT (ELECTROSURGICAL) ×2
ELECTRODE CAUTERY BLDE TIP 2.5 (TIP) ×1 IMPLANT
ELECTRODE REM PT RTRN 9FT ADLT (ELECTROSURGICAL) ×1 IMPLANT
GAUZE 4X4 16PLY ~~LOC~~+RFID DBL (SPONGE) ×2 IMPLANT
GLOVE SURG ORTHO LTX SZ7.5 (GLOVE) ×5 IMPLANT
GOWN STRL REUS W/ TWL LRG LVL3 (GOWN DISPOSABLE) ×2 IMPLANT
GOWN STRL REUS W/TWL LRG LVL3 (GOWN DISPOSABLE) ×6
KIT MARKER MARGIN INK (KITS) IMPLANT
KIT TURNOVER KIT A (KITS) ×2 IMPLANT
MANIFOLD NEPTUNE II (INSTRUMENTS) ×2 IMPLANT
NEEDLE HYPO 22GX1.5 SAFETY (NEEDLE) ×2 IMPLANT
PACK BASIN MINOR ARMC (MISCELLANEOUS) ×2 IMPLANT
SET LOCALIZER 20 PROBE US (MISCELLANEOUS) ×2 IMPLANT
SUT MNCRL 4-0 (SUTURE) ×2
SUT MNCRL 4-0 27XMFL (SUTURE) ×1
SUT VIC AB 3-0 SH 27 (SUTURE) ×2
SUT VIC AB 3-0 SH 27X BRD (SUTURE) ×1 IMPLANT
SUTURE MNCRL 4-0 27XMF (SUTURE) ×1 IMPLANT
SYR 20ML LL LF (SYRINGE) ×2 IMPLANT
TRAP NEPTUNE SPECIMEN COLLECT (MISCELLANEOUS) ×2 IMPLANT
WATER STERILE IRR 1000ML POUR (IV SOLUTION) ×2 IMPLANT
WATER STERILE IRR 500ML POUR (IV SOLUTION) ×1 IMPLANT

## 2021-05-20 NOTE — Op Note (Signed)
RF ID tag localized left lateral breast biopsy.  Pre-operative Diagnosis: Left breast papilloma, atypical ductal hyperplasia possible.    Post-operative Diagnosis: Same  Surgeon: Ronny Bacon, M.D., [facility]  Anesthesia: General LMA  Procedure: Left breast excisional biopsy, RFID tag directed.  Procedure Details  The patient was seen again in the Holding Room. The benefits, complications, treatment options, and expected outcomes were discussed with the patient. The risks of bleeding, infection, recurrence of symptoms, failure to resolve symptoms, hematoma, seroma, open wound, cosmetic deformity, and the need for further surgery were discussed.  The patient was taken to Operating Room, identified as Destiny West and the procedure verified.  A Time Out was held and the above information confirmed.  Prior to the induction of general anesthesia, antibiotic prophylaxis was administered. VTE prophylaxis was in place. The patient was positioned in the supine position. Appropriate anesthesia was then administered and tolerated well. The LOCALizer is used to mark the skin for incision.   Attention was turned to the RFID tag localization site where an incision was made. Dissection using the LOCALizer to perform a lumpectomy with adequate margins was performed. This was done with electrocautery and sharp dissection with Mayo scissors.  The RF ID tag appeared to be somewhat asymmetric within the specimen, I therefore excised additional breast tissue from this aspect of the biopsy cavity.  There was minimal bleeding, and the cavity packed.    I returned to the cavity to remove the packing, and hemostasis was confirmed with electrocautery.   Once assuring that hemostasis was adequate, I irrigated the wound with multiple aliquots to confirmed adequate hemostasis.  And checked multiple times,  the wound was closed with interrupted 3-0 Vicryl followed by 4-0 subcuticular Monocryl sutures.  Dermabond is utilized to  seal the incision.    Findings: Faxitron imaging: Confirmed the presence of both the RF ID tag and the original biopsy clip.  Estimated Blood Loss: Minimal         Drains: None         Specimens: Lateral left breast tissue.       Complications: None         Condition: Stable     Ronny Bacon, M.D., [facility]  05/20/2021 ; 2:51 PM

## 2021-05-20 NOTE — H&P (Signed)
Chief Complaint: Ductal papilloma left breast.   History of Present Illness Destiny West is a 51 y.o. female with screening mammography and subsequent ultrasound imaging showing an intraductal lesion, this was percutaneously biopsied and confirmed to be a benign papilloma.  Plan is to proceed with complete excisional biopsy. She has not utilized birth control or hormonal therapy.  She is premenopausal.  Her mother had breast cancer in her 53s.  She began menstruating at the age of 34.  She has been pregnant once with 1 miscarriage.  She was 51 years old at the time of her first pregnancy. She has felt no breast lump, breast tenderness or nipple discharge.  She denies any history of skin changes.  She performs breast self examinations.  She does her mammograms annually.   Past Medical History     Past Medical History:  Diagnosis Date   Asthma     Chicken pox     Diabetes mellitus without complication ([place])      boderline   Hyperlipidemia     Hypertension               Past Surgical History:  Procedure Laterality Date   POLYPECTOMY        Uterine      No Known Allergies         Current Outpatient Medications  Medication Sig Dispense Refill   albuterol (PROAIR HFA) 108 (90 Base) MCG/ACT inhaler Inhale 2 puffs into the lungs every 6 (six) hours as needed for wheezing or shortness of breath. 1 Inhaler 0   amLODipine (NORVASC) 5 MG tablet Take 1 tablet (5 mg total) by mouth daily. For blood pressure. 90 tablet 2   DUREZOL 0.05 % EMUL Place 1 drop into the left eye 2 (two) times daily.       hydrochlorothiazide (HYDRODIURIL) 12.5 MG tablet Take 2 tablets (25 mg total) by mouth daily. For blood pressure. 180 tablet 3   losartan (COZAAR) 100 MG tablet TAKE 1 TABLET BY MOUTH EVERY DAY FOR BLOOD PRESSURE 90 tablet 1   metFORMIN (GLUCOPHAGE) 1000 MG tablet TAKE 1 TABLET (1,000 MG TOTAL) BY MOUTH 2 (TWO) TIMES DAILY WITH A MEAL. FOR DIABETES. 180 tablet 2   rosuvastatin (CRESTOR) 10 MG  tablet TAKE 1 TABLET (10 MG TOTAL) BY MOUTH EVERY EVENING. FOR CHOLESTEROL. 90 tablet 2   sertraline (ZOLOFT) 100 MG tablet TAKE 1 TABLET (100 MG TOTAL) BY MOUTH DAILY. FOR ANXIETY 90 tablet 1   valACYclovir (VALTREX) 500 MG tablet Take 500 mg by mouth daily.        No current facility-administered medications for this visit.      Family History      Family History  Problem Relation Age of Onset   Cancer Mother          breast   Breast cancer Mother 64   Heart disease Father     Hypertension Father     Heart disease Maternal Grandfather     Hypertension Maternal Grandfather     Diabetes Maternal Grandfather          Social History Social History         Tobacco Use   Smoking status: Never   Smokeless tobacco: Never  Substance Use Topics   Alcohol use: Yes      Alcohol/week: 0.0 standard drinks      Comment: rarely          Review of Systems  Constitutional:  Negative for chills,  diaphoresis, fever, malaise/fatigue and weight loss.  HENT:  Negative for hearing loss.   Eyes:  Positive for blurred vision (Diminished vision in left eye currently being evaluated.).  Respiratory:  Negative for cough and shortness of breath.   Cardiovascular:  Negative for chest pain and palpitations.  Gastrointestinal:  Negative for abdominal pain, blood in stool, constipation, diarrhea, heartburn, melena, nausea and vomiting.  Genitourinary: Negative.   Skin:  Negative for itching and rash.  Neurological:  Negative for dizziness and headaches.  Psychiatric/Behavioral: Negative.        Physical Exam Blood pressure (!) 142/82, pulse 83, temperature 98.9 F (37.2 C), temperature source Oral, height 5' (1.524 m), weight 235 lb 9.6 oz (106.9 kg), SpO2 98 %.      CONSTITUTIONAL: Well developed, and nourished, appropriately responsive and aware without distress.   EYES: Sclera non-icteric.   EARS, NOSE, MOUTH AND THROAT: Mask worn.   Hearing is intact to voice.  NECK: Trachea is  midline, and there is no jugular venous distension.  LYMPH NODES:  Lymph nodal enlargement in the neck are not noted. RESPIRATORY:  Lungs are clear, and breath sounds are equal bilaterally. Normal respiratory effort without pathologic use of accessory muscles. CARDIOVASCULAR: Heart is regular in rate and rhythm. MUSCULOSKELETAL: No obvious defects noted in her four extremities.    SKIN: Skin turgor is normal. No pathologic skin lesions appreciated.  NEUROLOGIC:  Motor and sensation appear grossly normal.  Cranial nerves are grossly without defect. PSYCH:  Alert and oriented to person, place and time. Affect is appropriate for situation.   Data Reviewed I have personally reviewed what is currently available of the patient's imaging, recent labs and medical records.   Labs:  CBC Latest Ref Rng & Units 06/06/2019 12/02/2016 05/01/2015  WBC 4.0 - 10.5 K/uL 11.4(H) 10.4 10.6(H)  Hemoglobin 12.0 - 15.0 g/dL 13.0 13.7 13.5  Hematocrit 36.0 - 46.0 % 39.5 42.0 40.9  Platelets 150.0 - 400.0 K/uL 393.0 334.0 353.0    CMP Latest Ref Rng & Units 06/09/2020 06/06/2019 05/30/2018  Glucose 70 - 99 mg/dL 137(H) 152(H) 175(H)  BUN 6 - 23 mg/dL 13 13 15   Creatinine 0.40 - 1.20 mg/dL 0.73 0.76 0.78  Sodium 135 - 145 mEq/L 139 139 140  Potassium 3.5 - 5.1 mEq/L 3.9 3.5 4.2  Chloride 96 - 112 mEq/L 102 99 100  CO2 19 - 32 mEq/L 27 28 29   Calcium 8.4 - 10.5 mg/dL 9.6 9.8 10.1  Total Protein 6.0 - 8.3 g/dL 7.1 7.4 7.4  Total Bilirubin 0.2 - 1.2 mg/dL 0.3 0.4 0.4  Alkaline Phos 39 - 117 U/L 36(L) 43 39  AST 0 - 37 U/L 19 20 26   ALT 0 - 35 U/L 18 29 34    SURGICAL PATHOLOGY  CASE: [id number]  PATIENT: Destiny West  Surgical Pathology Report   Specimen Submitted:  A. Breast, left   Clinical History: Intraductal mass vs debris.  Papilloma vs duct  ectasia. Venus-shaped clip placed following ultrasound guided biopsy of  LEFT breast lower outer quadrant.   DIAGNOSIS:  A. BREAST, LEFT AT 3:00, 4 CM FROM THE  NIPPLE; ULTRASOUND-GUIDED CORE  NEEDLE BIOPSY:  - FRAGMENTS OF BENIGN, PARTIALLY SCLEROSED INTRADUCTAL PAPILLOMA WITH  ASSOCIATED USUAL DUCTAL HYPERPLASIA AND APOCRINE METAPLASIA.  - NO DEFINITE EVIDENCE OF ATYPICAL PROLIFERATIVE BREAST DISEASE IN THE  CURRENT SAMPLE.   Comment:  Excision is recommended for definitive classification, and exclusion of  potentially unsampled atypia.      Imaging:  CLINICAL DATA:  Left breast focal asymmetry seen on most recent screening mammography.   EXAM: DIGITAL DIAGNOSTIC UNILATERAL LEFT MAMMOGRAM WITH TOMOSYNTHESIS AND CAD; ULTRASOUND LEFT BREAST LIMITED   TECHNIQUE: Left digital diagnostic mammography and breast tomosynthesis was performed. The images were evaluated with computer-aided detection.; Targeted ultrasound examination of the left breast was performed.   COMPARISON:  Previous exam(s).   ACR Breast Density Category b: There are scattered areas of fibroglandular density.   FINDINGS: Additional mammographic views of the left breast demonstrate a linear focal asymmetry, favored to represent a dilated duct in the lower outer left breast, anterior to middle depth.   Targeted left breast ultrasound is performed demonstrating a single branching dilated duct in the left 3 o'clock breast 4 cm from the nipple. This finding corresponds to the mammographically seen abnormality. Within the middle portion of the dilated duct there is an intraductal mass versus debris which measures 0.5 by 0.4 by 1.1 cm. There is no evidence of left axillary lymphadenopathy.   IMPRESSION: Left breast 3 o'clock duct ectasia with probable intraductal mass, for which ultrasound-guided core needle biopsy is recommended.   RECOMMENDATION: Ultrasound-guided core needle biopsy of the left breast.   Further evaluation with breast MRI may also be considered, following the ultrasound-guided biopsy.   I have discussed the findings and recommendations with the  patient. If applicable, a reminder letter will be sent to the patient regarding the next appointment.   BI-RADS CATEGORY  4: Suspicious.     Electronically Signed   By: Fidela Salisbury M.D.   On: 03/30/2021 15:09 Within last 24 hrs: No results found.   Assessment   Assessment    Intraductal papilloma left breast.     Patient Active Problem List    Diagnosis Date Noted   Sciatica without back pain, right 12/09/2020   Herpes zoster ophthalmicus of left eye 06/11/2020   Screening for cervical cancer 11/30/2018   Morbid obesity with BMI of 45.0-49.9, adult ([place]) 06/03/2017   Preventative health care 05/09/2015   Type 2 diabetes mellitus ([place]) 11/26/2014   Essential hypertension 11/26/2014   Hyperlipidemia 11/26/2014   GAD (generalized anxiety disorder) 11/26/2014   Asthma in adult without complication 35/00/9381      Plan     Plan    RF ID tag localized left breast excisional biopsy. We reviewed the need for an additional radiologically placed tag for removal during surgery.  Risks of the procedure include but are not limited to anesthesia, bleeding/bruising, infection, scarring, increase in or change in diagnoses.  Questions adequately answered, I believe to her content.  No guarantees were ever expressed or implied.

## 2021-05-20 NOTE — Anesthesia Postprocedure Evaluation (Signed)
Anesthesia Post Note  Patient: Destiny West  Procedure(s) Performed: BREAST LUMPECTOMY WITH RADIO FREQUENCY LOCALIZER (Left)  Patient location during evaluation: PACU Anesthesia Type: General Level of consciousness: awake and alert Pain management: pain level controlled Vital Signs Assessment: post-procedure vital signs reviewed and stable Respiratory status: spontaneous breathing, nonlabored ventilation, respiratory function stable and patient connected to nasal cannula oxygen Cardiovascular status: blood pressure returned to baseline and stable Postop Assessment: no apparent nausea or vomiting Anesthetic complications: no   No notable events documented.   Last Vitals:  Vitals:   05/20/21 1520 05/20/21 1530  BP: 113/63 118/64  Pulse: 73 76  Resp: 15 14  Temp:  36.5 C  SpO2: 92% 93%    Last Pain:  Vitals:   05/20/21 1530  TempSrc: Temporal  PainSc: 0-No pain                 Precious Haws [name]

## 2021-05-20 NOTE — Interval H&P Note (Signed)
History and Physical Interval Note:  05/20/2021 1:20 PM  Destiny West  has presented today for surgery, with the diagnosis of left breast papilloma.  The various methods of treatment have been discussed with the patient and family. After consideration of risks, benefits and other options for treatment, the patient has consented to  Procedure(s): BREAST LUMPECTOMY WITH RADIO FREQUENCY LOCALIZER (Left) as a surgical intervention.  The patient's history has been reviewed, patient examined, no change in status, stable for surgery.  I have reviewed the patient's chart and labs.  Questions were answered to the patient's satisfaction.    Left breast is marked.   Ronny Bacon

## 2021-05-20 NOTE — Transfer of Care (Signed)
Immediate Anesthesia Transfer of Care Note  Patient: Destiny West  Procedure(s) Performed: BREAST LUMPECTOMY WITH RADIO FREQUENCY LOCALIZER (Left)  Patient Location: PACU  Anesthesia Type:General  Level of Consciousness: awake, alert  and oriented  Airway & Oxygen Therapy: Patient Spontanous Breathing  Post-op Assessment: Report given to RN  Post vital signs: Reviewed and stable  Last Vitals:  Vitals Value Taken Time  BP    Temp    Pulse 90 05/20/21 1449  Resp 17 05/20/21 1449  SpO2 93 % 05/20/21 1449  Vitals shown include unvalidated device data.  Last Pain:  Vitals:   05/20/21 1222  TempSrc: Temporal  PainSc: 0-No pain         Complications: No notable events documented.

## 2021-05-20 NOTE — Anesthesia Procedure Notes (Signed)
Procedure Name: LMA Insertion Date/Time: 05/20/2021 1:59 PM Performed by: Jonna Clark, CRNA Pre-anesthesia Checklist: Patient identified, Patient being monitored, Timeout performed, Emergency Drugs available and Suction available Patient Re-evaluated:Patient Re-evaluated prior to induction Oxygen Delivery Method: Circle system utilized Preoxygenation: Pre-oxygenation with 100% oxygen Induction Type: IV induction Ventilation: Mask ventilation without difficulty LMA: LMA inserted Tube type: Oral Tube size: 4.0 mm Number of attempts: 1 Placement Confirmation: positive ETCO2 and breath sounds checked- equal and bilateral Tube secured with: Tape Dental Injury: Teeth and Oropharynx as per pre-operative assessment

## 2021-05-20 NOTE — Anesthesia Preprocedure Evaluation (Addendum)
Anesthesia Evaluation  Patient identified by MRN, date of birth, ID band Patient awake    Reviewed: Allergy & Precautions, NPO status , Patient's Chart, lab work & pertinent test results  History of Anesthesia Complications Negative for: history of anesthetic complications  Airway Mallampati: III  TM Distance: >3 FB Neck ROM: full    Dental  (+) Chipped   Pulmonary neg shortness of breath, asthma ,    Pulmonary exam normal        Cardiovascular Exercise Tolerance: Good hypertension, (-) anginaNormal cardiovascular exam     Neuro/Psych PSYCHIATRIC DISORDERS  Neuromuscular disease negative psych ROS   GI/Hepatic negative GI ROS, Neg liver ROS, neg GERD  ,  Endo/Other  diabetes, Type 2  Renal/GU      Musculoskeletal   Abdominal   Peds  Hematology negative hematology ROS (+)   Anesthesia Other Findings Past Medical History: No date: Anxiety No date: Asthma No date: Chicken pox No date: Diabetes mellitus without complication ([facility])     Comment:  boderline No date: Hyperlipidemia No date: Hypertension  Past Surgical History: 2012: POLYPECTOMY     Comment:  Uterine  BMI    Body Mass Index: 45.90 kg/m      Reproductive/Obstetrics negative OB ROS                             Anesthesia Physical Anesthesia Plan  ASA: 3  Anesthesia Plan: General LMA   Post-op Pain Management:    Induction: Intravenous  PONV Risk Score and Plan: Ondansetron, Dexamethasone, Midazolam and Treatment may vary due to age or medical condition  Airway Management Planned: LMA  Additional Equipment:   Intra-op Plan:   Post-operative Plan: Extubation in OR  Informed Consent: I have reviewed the patients History and Physical, chart, labs and discussed the procedure including the risks, benefits and alternatives for the proposed anesthesia with the patient or authorized representative who has indicated  his/her understanding and acceptance.     Dental Advisory Given  Plan Discussed with: Anesthesiologist, CRNA and Surgeon  Anesthesia Plan Comments: (Patient consented for risks of anesthesia including but not limited to:  - adverse reactions to medications - damage to eyes, teeth, lips or other oral mucosa - nerve damage due to positioning  - sore throat or hoarseness - Damage to heart, brain, nerves, lungs, other parts of body or loss of life  Patient voiced understanding.)       Anesthesia Quick Evaluation

## 2021-05-20 NOTE — Discharge Instructions (Signed)

## 2021-05-21 ENCOUNTER — Encounter: Payer: Self-pay | Admitting: Surgery

## 2021-05-26 LAB — SURGICAL PATHOLOGY

## 2021-05-27 ENCOUNTER — Encounter: Payer: Self-pay | Admitting: Ophthalmology

## 2021-06-02 ENCOUNTER — Other Ambulatory Visit: Payer: Self-pay

## 2021-06-02 ENCOUNTER — Encounter: Payer: Self-pay | Admitting: Surgery

## 2021-06-02 ENCOUNTER — Ambulatory Visit (INDEPENDENT_AMBULATORY_CARE_PROVIDER_SITE_OTHER): Payer: BC Managed Care – PPO | Admitting: Surgery

## 2021-06-02 VITALS — BP 124/75 | HR 90 | Temp 98.4°F | Ht 60.0 in | Wt 244.0 lb

## 2021-06-02 DIAGNOSIS — Z09 Encounter for follow-up examination after completed treatment for conditions other than malignant neoplasm: Secondary | ICD-10-CM

## 2021-06-02 DIAGNOSIS — D242 Benign neoplasm of left breast: Secondary | ICD-10-CM

## 2021-06-02 NOTE — Patient Instructions (Addendum)
We will contact you in June 2023 to schedule Left breast mammogram and breast exam. If you do not hear from our office please call so we can get this scheduled for you.

## 2021-06-02 NOTE — Progress Notes (Signed)
[facility] POST-OP OFFICE VISIT  06/02/2021  HPI: Destiny West is a 51 y.o. female s/p RF ID tag left outer lower quadrant breast biopsy.  Pathology confirms benign diagnosis.  Papilloma with usual ductal hyperplasia.  She has no complaints, no concerns.  Vital signs: BP 124/75    Pulse 90    Temp 98.4 F (36.9 C) (Oral)    Ht 5' (1.524 m)    Wt 244 lb (110.7 kg)    LMP 05/13/2021    SpO2 97%    BMI 47.65 kg/m    Physical Exam: Constitutional: She appears well  Skin: Outer lower quadrant left breast incision, clean dry and intact.  There is no ecchymosis, no induration, no discoloration.  Dermabond is dry and gradually flaking away.  Assessment/Plan: This is a 51 y.o. female s/p outer lower quadrant RF ID tag left breast biopsy.  Doing well.  Patient Active Problem List   Diagnosis Date Noted   Intraductal papilloma of left breast 04/14/2021   Sciatica without back pain, right 12/09/2020   Herpes zoster ophthalmicus of left eye 06/11/2020   Screening for cervical cancer 11/30/2018   Morbid obesity with BMI of 45.0-49.9, adult ([place]) 06/03/2017   Preventative health care 05/09/2015   Type 2 diabetes mellitus ([place]) 11/26/2014   Essential hypertension 11/26/2014   Hyperlipidemia 11/26/2014   GAD (generalized anxiety disorder) 11/26/2014   Asthma in adult without complication 96/78/9381    -Diagnostic left breast mammogram in 6 months.,  Anticipating resumption of annual screening mammography.  Follow-up as needed.   Ronny Bacon M.D., FACS 06/02/2021, 9:47 AM

## 2021-06-08 ENCOUNTER — Other Ambulatory Visit: Payer: Self-pay | Admitting: Primary Care

## 2021-06-08 DIAGNOSIS — F411 Generalized anxiety disorder: Secondary | ICD-10-CM

## 2021-06-08 NOTE — Discharge Instructions (Signed)

## 2021-06-10 ENCOUNTER — Ambulatory Visit
Admission: RE | Admit: 2021-06-10 | Discharge: 2021-06-10 | Disposition: A | Discharge status: Home / Self Care | Payer: BC Managed Care – PPO | Attending: Ophthalmology | Admitting: Ophthalmology

## 2021-06-10 ENCOUNTER — Ambulatory Visit: Payer: BC Managed Care – PPO | Admitting: Anesthesiology

## 2021-06-10 ENCOUNTER — Other Ambulatory Visit: Payer: Self-pay

## 2021-06-10 ENCOUNTER — Encounter: Payer: Self-pay | Admitting: Ophthalmology

## 2021-06-10 ENCOUNTER — Encounter
Admission: RE | Disposition: A | Discharge status: Home / Self Care | Payer: Self-pay | Source: Home / Self Care | Attending: Ophthalmology

## 2021-06-10 DIAGNOSIS — Z6841 Body Mass Index (BMI) 40.0 and over, adult: Secondary | ICD-10-CM | POA: Insufficient documentation

## 2021-06-10 DIAGNOSIS — F419 Anxiety disorder, unspecified: Secondary | ICD-10-CM | POA: Insufficient documentation

## 2021-06-10 DIAGNOSIS — I10 Essential (primary) hypertension: Secondary | ICD-10-CM | POA: Insufficient documentation

## 2021-06-10 DIAGNOSIS — J45909 Unspecified asthma, uncomplicated: Secondary | ICD-10-CM | POA: Diagnosis not present

## 2021-06-10 DIAGNOSIS — H2512 Age-related nuclear cataract, left eye: Secondary | ICD-10-CM | POA: Diagnosis not present

## 2021-06-10 DIAGNOSIS — E1136 Type 2 diabetes mellitus with diabetic cataract: Secondary | ICD-10-CM | POA: Insufficient documentation

## 2021-06-10 HISTORY — PX: CATARACT EXTRACTION W/PHACO: SHX586

## 2021-06-10 LAB — GLUCOSE, CAPILLARY
Glucose-Capillary: 115 mg/dL — ABNORMAL HIGH (ref 70–99)
Glucose-Capillary: 122 mg/dL — ABNORMAL HIGH (ref 70–99)

## 2021-06-10 SURGERY — PHACOEMULSIFICATION, CATARACT, WITH IOL INSERTION
Anesthesia: Monitor Anesthesia Care | Site: Eye | Laterality: Left

## 2021-06-10 MED ORDER — SIGHTPATH DOSE#1 BSS IO SOLN
INTRAOCULAR | Status: DC | PRN
  Administered 2021-06-10: 15 mL

## 2021-06-10 MED ORDER — TETRACAINE HCL 0.5 % OP SOLN
1.0000 [drp] | OPHTHALMIC | Status: DC | PRN
  Administered 2021-06-10 (×3): 1 [drp] via OPHTHALMIC

## 2021-06-10 MED ORDER — BRIMONIDINE TARTRATE-TIMOLOL 0.2-0.5 % OP SOLN
OPHTHALMIC | Status: DC | PRN
  Administered 2021-06-10: 1 [drp] via OPHTHALMIC

## 2021-06-10 MED ORDER — SIGHTPATH DOSE#1 NA HYALUR & NA CHOND-NA HYALUR IO KIT
PACK | INTRAOCULAR | Status: DC | PRN
Start: 2021-06-10 — End: 2021-06-10
  Administered 2021-06-10: 1 via OPHTHALMIC

## 2021-06-10 MED ORDER — MIDAZOLAM HCL 2 MG/2ML IJ SOLN
INTRAMUSCULAR | Status: DC | PRN
  Administered 2021-06-10 (×2): 1 mg via INTRAVENOUS

## 2021-06-10 MED ORDER — MOXIFLOXACIN HCL 0.5 % OP SOLN
OPHTHALMIC | Status: DC | PRN
  Administered 2021-06-10: 0.2 mL via OPHTHALMIC

## 2021-06-10 MED ORDER — SIGHTPATH DOSE#1 BSS IO SOLN
INTRAOCULAR | Status: DC | PRN
  Administered 2021-06-10: 42 mL via OPHTHALMIC

## 2021-06-10 MED ORDER — ACETAMINOPHEN 325 MG PO TABS: 325.0000 mg | ORAL_TABLET | ORAL | Status: DC | PRN

## 2021-06-10 MED ORDER — LACTATED RINGERS IV SOLN: INTRAVENOUS | Status: DC

## 2021-06-10 MED ORDER — FENTANYL CITRATE (PF) 100 MCG/2ML IJ SOLN
INTRAMUSCULAR | Status: DC | PRN
  Administered 2021-06-10: 50 ug via INTRAVENOUS

## 2021-06-10 MED ORDER — ACETAMINOPHEN 160 MG/5ML PO SOLN: 325.0000 mg | ORAL | Status: DC | PRN

## 2021-06-10 MED ORDER — ARMC OPHTHALMIC DILATING DROPS
1.0000 "application " | OPHTHALMIC | Status: DC | PRN
  Administered 2021-06-10 (×3): 1 via OPHTHALMIC

## 2021-06-10 MED ORDER — LIDOCAINE HCL (PF) 2 % IJ SOLN
INTRAMUSCULAR | Status: DC | PRN
  Administered 2021-06-10: 1 mL via INTRAMUSCULAR

## 2021-06-10 SURGICAL SUPPLY — 12 items
CATARACT SUITE SIGHTPATH (MISCELLANEOUS) ×2 IMPLANT
FEE CATARACT SUITE SIGHTPATH (MISCELLANEOUS) ×1 IMPLANT
GLOVE SRG 8 PF TXTR STRL LF DI (GLOVE) ×1 IMPLANT
GLOVE SURG ENC TEXT LTX SZ7.5 (GLOVE) ×2 IMPLANT
GLOVE SURG UNDER POLY LF SZ8 (GLOVE) ×2
LENS IOL DIOP 14.5 (Intraocular Lens) ×2 IMPLANT
LENS IOL TECNIS MONO 14.5 (Intraocular Lens) IMPLANT
NDL FILTER BLUNT 18X1 1/2 (NEEDLE) ×1 IMPLANT
NEEDLE FILTER BLUNT 18X 1/2SAF (NEEDLE) ×1
NEEDLE FILTER BLUNT 18X1 1/2 (NEEDLE) ×1 IMPLANT
SYR 3ML LL SCALE MARK (SYRINGE) ×2 IMPLANT
WATER STERILE IRR 250ML POUR (IV SOLUTION) ×2 IMPLANT

## 2021-06-10 NOTE — Anesthesia Postprocedure Evaluation (Signed)
Anesthesia Post Note  Patient: Shayona Hibbitts  Procedure(s) Performed: CATARACT EXTRACTION PHACO AND INTRAOCULAR LENS PLACEMENT (IOC) LEFT DIABETIC 2.52 00:28.0 (Left: Eye)     Patient location during evaluation: PACU Anesthesia Type: MAC Level of consciousness: awake and alert Pain management: pain level controlled Vital Signs Assessment: post-procedure vital signs reviewed and stable Respiratory status: spontaneous breathing, nonlabored ventilation, respiratory function stable and patient connected to nasal cannula oxygen Cardiovascular status: stable and blood pressure returned to baseline Postop Assessment: no apparent nausea or vomiting Anesthetic complications: no   No notable events documented.  Trecia Rogers

## 2021-06-10 NOTE — H&P (Signed)
[facility]   Primary Care Physician:  Pleas Koch, NP Ophthalmologist: Dr. Leandrew Koyanagi  Pre-Procedure History & Physical: HPI:  Destiny West is a 51 y.o. female here for ophthalmic surgery.   Past Medical History:  Diagnosis Date   Anxiety    Asthma    Chicken pox    Diabetes mellitus without complication ([place])    boderline   Hyperlipidemia    Hypertension     Past Surgical History:  Procedure Laterality Date   BREAST LUMPECTOMY WITH RADIO FREQUENCY LOCALIZER Left 05/20/2021   Procedure: BREAST LUMPECTOMY WITH RADIO FREQUENCY LOCALIZER;  Surgeon: Ronny Bacon, MD;  Location: [facility] ORS;  Service: General;  Laterality: Left;   POLYPECTOMY  2012   Uterine    Prior to Admission medications   Medication Sig Start Date End Date Taking? Authorizing Provider  albuterol (PROAIR HFA) 108 (90 Base) MCG/ACT inhaler Inhale 2 puffs into the lungs every 6 (six) hours as needed for wheezing or shortness of breath. 06/05/18  Yes Pleas Koch, NP  amLODipine (NORVASC) 5 MG tablet Take 1 tablet (5 mg total) by mouth daily. For blood pressure. Office visit required for further refills. 05/18/21  Yes Pleas Koch, NP  Cholecalciferol (VITAMIN D) 50 MCG (2000 UT) tablet Take 4,000 Units by mouth daily.   Yes [provider]  hydrochlorothiazide (HYDRODIURIL) 12.5 MG tablet Take 2 tablets (25 mg total) by mouth daily. For blood pressure. 07/24/20  Yes Pleas Koch, NP  losartan (COZAAR) 100 MG tablet TAKE 1 TABLET BY MOUTH EVERY DAY FOR BLOOD PRESSURE 01/27/21  Yes Pleas Koch, NP  metFORMIN (GLUCOPHAGE) 1000 MG tablet Take 1 tablet (1,000 mg total) by mouth 2 (two) times daily with a meal. For diabetes. Office visit required for further refills. 05/18/21  Yes Pleas Koch, NP  Omega-3 Fatty Acids (FISH OIL) 1000 MG CAPS Take 1,000 mg by mouth in the morning and at bedtime.   Yes [provider]  rosuvastatin (CRESTOR) 10 MG tablet  Take 1 tablet (10 mg total) by mouth every evening. For cholesterol. Office visit required for further refills. 05/18/21  Yes Pleas Koch, NP  sertraline (ZOLOFT) 100 MG tablet TAKE 1 TABLET (100 MG TOTAL) BY MOUTH DAILY. FOR ANXIETY 06/08/21  Yes Pleas Koch, NP  valACYclovir (VALTREX) 500 MG tablet Take 500 mg by mouth daily. 04/29/20  Yes [provider]  DUREZOL 0.05 % EMUL Place 1 drop into the left eye every other day. 12/17/19   [provider]  simvastatin (ZOCOR) 20 MG tablet TAKE 1 TABLET BY MOUTH EVERY DAY IN THE EVENING 01/03/18 06/05/18  Pleas Koch, NP    Allergies as of 05/19/2021   (No Known Allergies)    Family History  Problem Relation Age of Onset   Cancer Mother        breast   Breast cancer Mother 45   Heart disease Father    Hypertension Father    Heart disease Maternal Grandfather    Hypertension Maternal Grandfather    Diabetes Maternal Grandfather     Social History   Socioeconomic History   Marital status: Married    Spouse name: Not on file   Number of children: Not on file   Years of education: Not on file   Highest education level: Not on file  Occupational History   Not on file  Tobacco Use   Smoking status: Never   Smokeless tobacco: Never  Vaping Use  Vaping Use: Never used  Substance and Sexual Activity   Alcohol use: Yes    Alcohol/week: 0.0 standard drinks    Comment: rarely   Drug use: Never   Sexual activity: Not on file  Other Topics Concern   Not on file  Social History Narrative   Married.   No children.   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   Enjoys reading, traveling, movies.   Social Determinants of Health   Financial Resource Strain: Not on file  Food Insecurity: Not on file  Transportation Needs: Not on file  Physical Activity: Not on file  Stress: Not on file  Social Connections: Not on file  Intimate Partner Violence: Not on file    Review of  Systems: See HPI, otherwise negative ROS  Physical Exam: BP 124/76    Pulse 82    Temp 98.1 F (36.7 C) (Temporal)    Resp 18    Ht 5' (1.524 m)    Wt 109.8 kg    LMP 05/13/2021    SpO2 97%    BMI 47.28 kg/m  General:   Alert,  pleasant and cooperative in NAD Head:  Normocephalic and atraumatic. Lungs:  Clear to auscultation.    Heart:  Regular rate and rhythm.   Impression/Plan: Destiny West is here for ophthalmic surgery.  Risks, benefits, limitations, and alternatives regarding ophthalmic surgery have been reviewed with the patient.  Questions have been answered.  All parties agreeable.   Leandrew Koyanagi, MD  06/10/2021, 10:06 AM

## 2021-06-10 NOTE — Anesthesia Preprocedure Evaluation (Addendum)
Anesthesia Evaluation  Patient identified by MRN, date of birth, ID band Patient awake    Reviewed: Allergy & Precautions, H&P , NPO status , Patient's Chart, lab work & pertinent test results, reviewed documented beta blocker date and time   Airway Mallampati: II  TM Distance: >3 FB Neck ROM: full    Dental no notable dental hx.    Pulmonary asthma ,    Pulmonary exam normal breath sounds clear to auscultation       Cardiovascular Exercise Tolerance: Good hypertension, Normal cardiovascular exam Rhythm:regular Rate:Normal     Neuro/Psych negative neurological ROS  negative psych ROS   GI/Hepatic negative GI ROS, Neg liver ROS,   Endo/Other  diabetes, Type 2Morbid obesity  Renal/GU negative Renal ROS  negative genitourinary   Musculoskeletal   Abdominal   Peds  Hematology negative hematology ROS (+)   Anesthesia Other Findings   Reproductive/Obstetrics negative OB ROS                            Anesthesia Physical Anesthesia Plan  ASA: 3  Anesthesia Plan: MAC   Post-op Pain Management:    Induction:   PONV Risk Score and Plan:   Airway Management Planned:   Additional Equipment:   Intra-op Plan:   Post-operative Plan:   Informed Consent: I have reviewed the patients History and Physical, chart, labs and discussed the procedure including the risks, benefits and alternatives for the proposed anesthesia with the patient or authorized representative who has indicated his/her understanding and acceptance.     Dental Advisory Given  Plan Discussed with: CRNA and Anesthesiologist  Anesthesia Plan Comments:         Anesthesia Quick Evaluation

## 2021-06-10 NOTE — Transfer of Care (Signed)
Immediate Anesthesia Transfer of Care Note  Patient: Destiny West  Procedure(s) Performed: CATARACT EXTRACTION PHACO AND INTRAOCULAR LENS PLACEMENT (IOC) LEFT DIABETIC 2.52 00:28.0 (Left: Eye)  Patient Location: PACU  Anesthesia Type: MAC  Level of Consciousness: awake, alert  and patient cooperative  Airway and Oxygen Therapy: Patient Spontanous Breathing and Patient connected to supplemental oxygen  Post-op Assessment: Post-op Vital signs reviewed, Patient's Cardiovascular Status Stable, Respiratory Function Stable, Patent Airway and No signs of Nausea or vomiting  Post-op Vital Signs: Reviewed and stable  Complications: No notable events documented.

## 2021-06-10 NOTE — Op Note (Signed)
OPERATIVE NOTE  Destiny West [id number] 06/10/2021   PREOPERATIVE DIAGNOSIS:  Nuclear sclerotic cataract left eye. H25.12   POSTOPERATIVE DIAGNOSIS:    Nuclear sclerotic cataract left eye.     PROCEDURE:  Phacoemusification with posterior chamber intraocular lens placement of the left eye  Ultrasound time: Procedure(s): CATARACT EXTRACTION PHACO AND INTRAOCULAR LENS PLACEMENT (IOC) LEFT DIABETIC 2.52 00:28.0 (Left)  LENS:   Implant Name Type Inv. Item Serial No. Manufacturer Lot No. LRB No. Used Action  LENS IOL DIOP 14.5 - [id number] Intraocular Lens LENS IOL DIOP 14.5 [id number] [employer]  Left 1 Implanted      SURGEON:  Wyonia Hough, MD   ANESTHESIA:  Topical with tetracaine drops and 2% Xylocaine jelly, augmented with 1% preservative-free intracameral lidocaine.    COMPLICATIONS:  None.   DESCRIPTION OF PROCEDURE:  The patient was identified in the holding room and transported to the operating room and placed in the supine position under the operating microscope.  The left eye was identified as the operative eye and it was prepped and draped in the usual sterile ophthalmic fashion.   A 1 millimeter clear-corneal paracentesis was made at the 1:30 position.  0.5 ml of preservative-free 1% lidocaine was injected into the anterior chamber.  The anterior chamber was filled with Viscoat viscoelastic.  A 2.4 millimeter keratome was used to make a near-clear corneal incision at the 10:30 position.  .  A curvilinear capsulorrhexis was made with a cystotome and capsulorrhexis forceps.  Balanced salt solution was used to hydrodissect and hydrodelineate the nucleus.   Phacoemulsification was then used in stop and chop fashion to remove the lens nucleus and epinucleus.  The remaining cortex was then removed using the irrigation and aspiration handpiece. Provisc was then placed into the capsular bag to distend it for lens placement.  A lens was then injected into the capsular bag.  The  remaining viscoelastic was aspirated.   Wounds were hydrated with balanced salt solution.  The anterior chamber was inflated to a physiologic pressure with balanced salt solution.  No wound leaks were noted. Vigamox 0.2 ml of a 1mg  per ml solution was injected into the anterior chamber for a dose of 0.2 mg of intracameral antibiotic at the completion of the case.   Timolol and Brimonidine drops were applied to the eye.  The patient was taken to the recovery room in stable condition without complications of anesthesia or surgery.  [name] 06/10/2021, 10:53 AM

## 2021-06-11 ENCOUNTER — Encounter: Payer: Self-pay | Admitting: Ophthalmology

## 2021-06-16 ENCOUNTER — Other Ambulatory Visit: Payer: Self-pay

## 2021-06-16 ENCOUNTER — Ambulatory Visit (INDEPENDENT_AMBULATORY_CARE_PROVIDER_SITE_OTHER): Payer: BC Managed Care – PPO | Admitting: Primary Care

## 2021-06-16 VITALS — BP 110/62 | HR 74 | Temp 98.6°F | Ht 60.0 in | Wt 242.0 lb

## 2021-06-16 DIAGNOSIS — F411 Generalized anxiety disorder: Secondary | ICD-10-CM

## 2021-06-16 DIAGNOSIS — Z0000 Encounter for general adult medical examination without abnormal findings: Secondary | ICD-10-CM

## 2021-06-16 DIAGNOSIS — E119 Type 2 diabetes mellitus without complications: Secondary | ICD-10-CM

## 2021-06-16 DIAGNOSIS — I10 Essential (primary) hypertension: Secondary | ICD-10-CM | POA: Diagnosis not present

## 2021-06-16 DIAGNOSIS — E785 Hyperlipidemia, unspecified: Secondary | ICD-10-CM

## 2021-06-16 DIAGNOSIS — Z1159 Encounter for screening for other viral diseases: Secondary | ICD-10-CM

## 2021-06-16 DIAGNOSIS — J4520 Mild intermittent asthma, uncomplicated: Secondary | ICD-10-CM

## 2021-06-16 DIAGNOSIS — Z114 Encounter for screening for human immunodeficiency virus [HIV]: Secondary | ICD-10-CM

## 2021-06-16 DIAGNOSIS — Z1211 Encounter for screening for malignant neoplasm of colon: Secondary | ICD-10-CM

## 2021-06-16 DIAGNOSIS — B0230 Zoster ocular disease, unspecified: Secondary | ICD-10-CM

## 2021-06-16 LAB — CBC
HCT: 40.6 % (ref 36.0–46.0)
Hemoglobin: 13.1 g/dL (ref 12.0–15.0)
MCHC: 32.2 g/dL (ref 30.0–36.0)
MCV: 79.9 fl (ref 78.0–100.0)
Platelets: 343 10*3/uL (ref 150.0–400.0)
RBC: 5.08 Mil/uL (ref 3.87–5.11)
RDW: 15.3 % (ref 11.5–15.5)
WBC: 9.6 10*3/uL (ref 4.0–10.5)

## 2021-06-16 LAB — MICROALBUMIN / CREATININE URINE RATIO
Creatinine,U: 109.5 mg/dL
Microalb Creat Ratio: 1.4 mg/g (ref 0.0–30.0)
Microalb, Ur: 1.5 mg/dL (ref 0.0–1.9)

## 2021-06-16 LAB — HEMOGLOBIN A1C: Hgb A1c MFr Bld: 7.3 % — ABNORMAL HIGH (ref 4.6–6.5)

## 2021-06-16 MED ORDER — SUTAB 1479-225-188 MG PO TABS: 12.0000 | ORAL_TABLET | Freq: Once | ORAL | 0 refills | Status: AC

## 2021-06-16 NOTE — Assessment & Plan Note (Signed)
Following with ophthalmology, continue Valtrex, will be reducing to 500 mg daily.

## 2021-06-16 NOTE — Patient Instructions (Signed)
Stop by the lab prior to leaving today. I will notify you of your results once received.   You will be contacted regarding your referral to GI for the colonsocopy.  Please let us know if you have not been contacted within two weeks.   It was a pleasure to see you today!  Preventive Care 34-49 Years Old, Female Preventive care refers to lifestyle choices and visits with your health care provider that can promote health and wellness. Preventive care visits are also called wellness exams. What can I expect for my preventive care visit? Counseling Your health care provider may ask you questions about your: Medical history, including: Past medical problems. Family medical history. Pregnancy history. Current health, including: Menstrual cycle. Method of birth control. Emotional well-being. Home life and relationship well-being. Sexual activity and sexual health. Lifestyle, including: Alcohol, nicotine or tobacco, and drug use. Access to firearms. Diet, exercise, and sleep habits. Work and work Statistician. Sunscreen use. Safety issues such as seatbelt and bike helmet use. Physical exam Your health care provider will check your: Height and weight. These may be used to calculate your BMI (body mass index). BMI is a measurement that tells if you are at a healthy weight. Waist circumference. This measures the distance around your waistline. This measurement also tells if you are at a healthy weight and may help predict your risk of certain diseases, such as type 2 diabetes and high blood pressure. Heart rate and blood pressure. Body temperature. Skin for abnormal spots. What immunizations do I need? Vaccines are usually given at various ages, according to a schedule. Your health care provider will recommend vaccines for you based on your age, medical history, and lifestyle or other factors, such as travel or where you work. What tests do I need? Screening Your health care provider may  recommend screening tests for certain conditions. This may include: Lipid and cholesterol levels. Diabetes screening. This is done by checking your blood sugar (glucose) after you have not eaten for a while (fasting). Pelvic exam and Pap test. Hepatitis B test. Hepatitis C test. HIV (human immunodeficiency virus) test. STI (sexually transmitted infection) testing, if you are at risk. Lung cancer screening. Colorectal cancer screening. Mammogram. Talk with your health care provider about when you should start having regular mammograms. This may depend on whether you have a family history of breast cancer. BRCA-related cancer screening. This may be done if you have a family history of breast, ovarian, tubal, or peritoneal cancers. Bone density scan. This is done to screen for osteoporosis. Talk with your health care provider about your test results, treatment options, and if necessary, the need for more tests. Follow these instructions at home: Eating and drinking  Eat a diet that includes fresh fruits and vegetables, whole grains, lean protein, and low-fat dairy products. Take vitamin and mineral supplements as recommended by your health care provider. Do not drink alcohol if: Your health care provider tells you not to drink. You are pregnant, may be pregnant, or are planning to become pregnant. If you drink alcohol: Limit how much you have to 0-1 drink a day. Know how much alcohol is in your drink. In the [place] one drink equals one 12 oz bottle of beer (355 mL), one 5 oz glass of wine (148 mL), or one 1 oz glass of hard liquor (44 mL). Lifestyle Brush your teeth every morning and night with fluoride toothpaste. Floss one time each day. Exercise for at least 30 minutes 5 or more days  each week. Do not use any products that contain nicotine or tobacco. These products include cigarettes, chewing tobacco, and vaping devices, such as e-cigarettes. If you need help quitting, ask your health  care provider. Do not use drugs. If you are sexually active, practice safe sex. Use a condom or other form of protection to prevent STIs. If you do not wish to become pregnant, use a form of birth control. If you plan to become pregnant, see your health care provider for a prepregnancy visit. Take aspirin only as told by your health care provider. Make sure that you understand how much to take and what form to take. Work with your health care provider to find out whether it is safe and beneficial for you to take aspirin daily. Find healthy ways to manage stress, such as: Meditation, yoga, or listening to music. Journaling. Talking to a trusted person. Spending time with friends and family. Minimize exposure to UV radiation to reduce your risk of skin cancer. Safety Always wear your seat belt while driving or riding in a vehicle. Do not drive: If you have been drinking alcohol. Do not ride with someone who has been drinking. When you are tired or distracted. While texting. If you have been using any mind-altering substances or drugs. Wear a helmet and other protective equipment during sports activities. If you have firearms in your house, make sure you follow all gun safety procedures. Seek help if you have been physically or sexually abused. What's next? Visit your health care provider once a year for an annual wellness visit. Ask your health care provider how often you should have your eyes and teeth checked. Stay up to date on all vaccines. This information is not intended to replace advice given to you by your health care provider. Make sure you discuss any questions you have with your health care provider. Document Revised: 10/15/2020 Document Reviewed: 10/15/2020 Elsevier Patient Education  [place].

## 2021-06-16 NOTE — Assessment & Plan Note (Signed)
Controlled.  Continue sertraline 100 mg daily.   

## 2021-06-16 NOTE — Assessment & Plan Note (Signed)
Controlled. Infrequent use of albuterol inhaler.   Continue albuterol PRN.

## 2021-06-16 NOTE — Assessment & Plan Note (Signed)
Repeat A1C pending.  Continue metformin 1000 mg BID.  Eye exam UTD. Managed on statin.  Urine microalbumin pending.  Follow up in 6 months.

## 2021-06-16 NOTE — Assessment & Plan Note (Signed)
Declines influenza vaccine. Pap smear due in Summer 2023. Mammogram UTD. Colonoscopy due, referral placed to GI.  Discussed the importance of a healthy diet and regular exercise in order for weight loss, and to reduce the risk of further co-morbidity.  Exam today stable. Labs pending.

## 2021-06-16 NOTE — Assessment & Plan Note (Signed)
Controlled.  Continue amlodipine 5 mg, losartan 100 mg daily. CMP pending.

## 2021-06-16 NOTE — Progress Notes (Signed)
Subjective:    Patient ID: Destiny West, female    DOB: 1970/06/20, 51 y.o.   MRN: [id number]  HPI  Destiny West is a very pleasant 51 y.o. female who presents today for complete physical and follow up of chronic conditions.  Immunizations: -Tetanus: 2017 -Influenza: Declines  -Covid-19: 3 vaccines   Diet: Fair diet.  Exercise: No regular exercise.  Eye exam: Completes annually  Dental exam: No recent visit   Pap Smear: Completed in July 2020 Mammogram: Completed in November, December 2022, and January 2023 Colonoscopy: Never completed   BP Readings from Last 3 Encounters:  06/16/21 110/62  06/10/21 109/61  06/02/21 124/75       Review of Systems  Constitutional:  Negative for unexpected weight change.  HENT:  Negative for rhinorrhea.   Respiratory:  Negative for cough and shortness of breath.   Cardiovascular:  Negative for chest pain.  Gastrointestinal:  Negative for constipation and diarrhea.  Genitourinary:  Negative for difficulty urinating and menstrual problem.  Musculoskeletal:  Negative for arthralgias and myalgias.  Skin:  Negative for rash.  Allergic/Immunologic: Negative for environmental allergies.  Neurological:  Negative for dizziness and headaches.  Psychiatric/Behavioral:  The patient is not nervous/anxious.         Past Medical History:  Diagnosis Date   Anxiety    Asthma    Chicken pox    Diabetes mellitus without complication ([place])    boderline   Hyperlipidemia    Hypertension     Social History   Socioeconomic History   Marital status: Married    Spouse name: Not on file   Number of children: Not on file   Years of education: Not on file   Highest education level: Not on file  Occupational History   Not on file  Tobacco Use   Smoking status: Never   Smokeless tobacco: Never  Vaping Use   Vaping Use: Never used  Substance and Sexual Activity   Alcohol use: Yes    Alcohol/week: 0.0 standard drinks    Comment: rarely    Drug use: Never   Sexual activity: Not on file  Other Topics Concern   Not on file  Social History Narrative   Married.   No children.   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   Enjoys reading, traveling, movies.   Social Determinants of Health   Financial Resource Strain: Not on file  Food Insecurity: Not on file  Transportation Needs: Not on file  Physical Activity: Not on file  Stress: Not on file  Social Connections: Not on file  Intimate Partner Violence: Not on file    Past Surgical History:  Procedure Laterality Date   BREAST LUMPECTOMY WITH RADIO FREQUENCY LOCALIZER Left 05/20/2021   Procedure: BREAST LUMPECTOMY WITH RADIO FREQUENCY LOCALIZER;  Surgeon: Ronny Bacon, MD;  Location: [facility];  Service: General;  Laterality: Left;   CATARACT EXTRACTION W/PHACO Left 06/10/2021   Procedure: CATARACT EXTRACTION PHACO AND INTRAOCULAR LENS PLACEMENT ([place]) LEFT DIABETIC 2.52 00:28.0;  Surgeon: Leandrew Koyanagi, MD;  Location: [place];  Service: Ophthalmology;  Laterality: Left;   POLYPECTOMY  2012   Uterine    Family History  Problem Relation Age of Onset   Cancer Mother        breast   Breast cancer Mother 86   Heart disease Father    Hypertension Father    Heart disease Maternal Grandfather    Hypertension Maternal Grandfather  Diabetes Maternal Grandfather     No Known Allergies  Current Outpatient Medications on File Prior to Visit  Medication Sig Dispense Refill   albuterol (PROAIR HFA) 108 (90 Base) MCG/ACT inhaler Inhale 2 puffs into the lungs every 6 (six) hours as needed for wheezing or shortness of breath. 1 Inhaler 0   amLODipine (NORVASC) 5 MG tablet Take 1 tablet (5 mg total) by mouth daily. For blood pressure. Office visit required for further refills. 90 tablet 0   Cholecalciferol (VITAMIN D) 50 MCG (2000 UT) tablet Take 4,000 Units by mouth daily.     DUREZOL 0.05 % EMUL Place 1 drop into the left eye  every other day.     hydrochlorothiazide (HYDRODIURIL) 12.5 MG tablet Take 2 tablets (25 mg total) by mouth daily. For blood pressure. 180 tablet 3   losartan (COZAAR) 100 MG tablet TAKE 1 TABLET BY MOUTH EVERY DAY FOR BLOOD PRESSURE 90 tablet 1   metFORMIN (GLUCOPHAGE) 1000 MG tablet Take 1 tablet (1,000 mg total) by mouth 2 (two) times daily with a meal. For diabetes. Office visit required for further refills. 180 tablet 0   Omega-3 Fatty Acids (FISH OIL) 1000 MG CAPS Take 1,000 mg by mouth in the morning and at bedtime.     rosuvastatin (CRESTOR) 10 MG tablet Take 1 tablet (10 mg total) by mouth every evening. For cholesterol. Office visit required for further refills. 90 tablet 0   sertraline (ZOLOFT) 100 MG tablet TAKE 1 TABLET (100 MG TOTAL) BY MOUTH DAILY. FOR ANXIETY 90 tablet 0   valACYclovir (VALTREX) 500 MG tablet Take 500 mg by mouth daily.     [DISCONTINUED] simvastatin (ZOCOR) 20 MG tablet TAKE 1 TABLET BY MOUTH EVERY DAY IN THE EVENING 90 tablet 1   No current facility-administered medications on file prior to visit.    BP 110/62    Pulse 74    Temp 98.6 F (37 C) (Oral)    Ht 5' (1.524 m)    Wt 242 lb (109.8 kg)    SpO2 97%    BMI 47.26 kg/m  Objective:   Physical Exam HENT:     Right Ear: Tympanic membrane and ear canal normal.     Left Ear: Tympanic membrane and ear canal normal.     Nose: Nose normal.  Eyes:     Conjunctiva/sclera: Conjunctivae normal.     Pupils: Pupils are equal, round, and reactive to light.  Neck:     Thyroid: No thyromegaly.  Cardiovascular:     Rate and Rhythm: Normal rate and regular rhythm.     Heart sounds: No murmur heard. Pulmonary:     Effort: Pulmonary effort is normal.     Breath sounds: Normal breath sounds. No rales.  Abdominal:     General: Bowel sounds are normal.     Palpations: Abdomen is soft.     Tenderness: There is no abdominal tenderness.  Musculoskeletal:        General: Normal range of motion.     Cervical back:  Neck supple.  Lymphadenopathy:     Cervical: No cervical adenopathy.  Skin:    General: Skin is warm and dry.     Findings: No rash.  Neurological:     Mental Status: She is alert and oriented to person, place, and time.     Cranial Nerves: No cranial nerve deficit.     Deep Tendon Reflexes: Reflexes are normal and symmetric.  Psychiatric:  Mood and Affect: Mood normal.          Assessment & Plan:      This visit occurred during the SARS-CoV-2 public health emergency.  Safety protocols were in place, including screening questions prior to the visit, additional usage of staff PPE, and extensive cleaning of exam room while observing appropriate contact time as indicated for disinfecting solutions.

## 2021-06-16 NOTE — Assessment & Plan Note (Signed)
Repeat lipid panel pending. Continue rosuvastatin 10 mg daily. 

## 2021-06-16 NOTE — Progress Notes (Signed)
Gastroenterology Pre-Procedure Review  Request Date: 07/20/2021 Requesting Physician: Dr. Marius Ditch  PATIENT REVIEW QUESTIONS: The patient responded to the following health history questions as indicated:    1. Are you having any GI issues? no 2. Do you have a personal history of Polyps? no 3. Do you have a family history of Colon Cancer or Polyps? no 4. Diabetes Mellitus? yes (Type II) 5. Joint replacements in the past 12 months?no 6. Major health problems in the past 3 months?no 7. Any artificial heart valves, MVP, or defibrillator?no    MEDICATIONS & ALLERGIES:    Patient reports the following regarding taking any anticoagulation/antiplatelet therapy:   Plavix, Coumadin, Eliquis, Xarelto, Lovenox, Pradaxa, Brilinta, or Effient? no Aspirin? no  Patient confirms/reports the following medications:  Current Outpatient Medications  Medication Sig Dispense Refill   albuterol (PROAIR HFA) 108 (90 Base) MCG/ACT inhaler Inhale 2 puffs into the lungs every 6 (six) hours as needed for wheezing or shortness of breath. 1 Inhaler 0   amLODipine (NORVASC) 5 MG tablet Take 1 tablet (5 mg total) by mouth daily. For blood pressure. Office visit required for further refills. 90 tablet 0   Cholecalciferol (VITAMIN D) 50 MCG (2000 UT) tablet Take 4,000 Units by mouth daily.     DUREZOL 0.05 % EMUL Place 1 drop into the left eye every other day.     hydrochlorothiazide (HYDRODIURIL) 12.5 MG tablet Take 2 tablets (25 mg total) by mouth daily. For blood pressure. 180 tablet 3   losartan (COZAAR) 100 MG tablet TAKE 1 TABLET BY MOUTH EVERY DAY FOR BLOOD PRESSURE 90 tablet 1   metFORMIN (GLUCOPHAGE) 1000 MG tablet Take 1 tablet (1,000 mg total) by mouth 2 (two) times daily with a meal. For diabetes. Office visit required for further refills. 180 tablet 0   Omega-3 Fatty Acids (FISH OIL) 1000 MG CAPS Take 1,000 mg by mouth in the morning and at bedtime.     rosuvastatin (CRESTOR) 10 MG tablet Take 1 tablet (10 mg  total) by mouth every evening. For cholesterol. Office visit required for further refills. 90 tablet 0   sertraline (ZOLOFT) 100 MG tablet TAKE 1 TABLET (100 MG TOTAL) BY MOUTH DAILY. FOR ANXIETY 90 tablet 0   valACYclovir (VALTREX) 500 MG tablet Take 500 mg by mouth daily.     No current facility-administered medications for this visit.    Patient confirms/reports the following allergies:  No Known Allergies  No orders of the defined types were placed in this encounter.   AUTHORIZATION INFORMATION Primary Insurance: 1D#: Group #:  Secondary Insurance: 1D#: Group #:  SCHEDULE INFORMATION: Date: 07/20/2021 Time: Location: [place]

## 2021-06-17 LAB — COMPREHENSIVE METABOLIC PANEL
ALT: 20 U/L (ref 0–35)
AST: 23 U/L (ref 0–37)
Albumin: 4.7 g/dL (ref 3.5–5.2)
Alkaline Phosphatase: 44 U/L (ref 39–117)
BUN: 17 mg/dL (ref 6–23)
CO2: 28 mEq/L (ref 19–32)
Calcium: 9.7 mg/dL (ref 8.4–10.5)
Chloride: 102 mEq/L (ref 96–112)
Creatinine, Ser: 0.69 mg/dL (ref 0.40–1.20)
GFR: 102.45 mL/min (ref >60.00)
Glucose, Bld: 123 mg/dL — ABNORMAL HIGH (ref 70–99)
Potassium: 4.4 mEq/L (ref 3.5–5.1)
Sodium: 141 mEq/L (ref 135–145)
Total Bilirubin: 0.3 mg/dL (ref 0.2–1.2)
Total Protein: 7.6 g/dL (ref 6.0–8.3)

## 2021-06-17 LAB — LIPID PANEL
Cholesterol: 192 mg/dL (ref 0–200)
HDL: 56.7 mg/dL (ref >39.00)
LDL Cholesterol: 103 mg/dL — ABNORMAL HIGH (ref 0–99)
NonHDL: 135.69
Total CHOL/HDL Ratio: 3
Triglycerides: 164 mg/dL — ABNORMAL HIGH (ref 0.0–149.0)
VLDL: 32.8 mg/dL (ref 0.0–40.0)

## 2021-06-17 LAB — HEPATITIS C ANTIBODY
Hepatitis C Ab: NONREACTIVE
SIGNAL TO CUT-OFF: 0.05 (ref <1.00)

## 2021-06-17 LAB — HIV ANTIBODY (ROUTINE TESTING W REFLEX): HIV 1&2 Ab, 4th Generation: NONREACTIVE

## 2021-06-23 ENCOUNTER — Telehealth: Payer: Self-pay

## 2021-06-23 NOTE — Telephone Encounter (Signed)
Patient left a voicemail because she needs to reschedule her colonoscopy

## 2021-06-24 ENCOUNTER — Telehealth: Payer: Self-pay

## 2021-06-24 ENCOUNTER — Other Ambulatory Visit: Payer: Self-pay

## 2021-06-24 NOTE — Telephone Encounter (Signed)
Patient called to inform us that her insurance would not cover Sutab. Inusrance needed a prior auth. Sample box of Sutab w/ instructions was given to patient in office.

## 2021-06-24 NOTE — Telephone Encounter (Signed)
Procedure has been moved to 07/31/21. Updated instructions will be sent.

## 2021-06-24 NOTE — Progress Notes (Signed)
Procedure rescheduled per patients request. Endo unit notified of change. Updated instructions sent.

## 2021-07-08 ENCOUNTER — Other Ambulatory Visit: Payer: Self-pay | Admitting: Primary Care

## 2021-07-08 DIAGNOSIS — I10 Essential (primary) hypertension: Secondary | ICD-10-CM

## 2021-07-24 ENCOUNTER — Other Ambulatory Visit: Payer: Self-pay | Admitting: Primary Care

## 2021-07-24 DIAGNOSIS — I10 Essential (primary) hypertension: Secondary | ICD-10-CM

## 2021-07-31 ENCOUNTER — Other Ambulatory Visit: Payer: Self-pay

## 2021-07-31 ENCOUNTER — Encounter: Payer: Self-pay | Admitting: Gastroenterology

## 2021-07-31 ENCOUNTER — Encounter
Admission: RE | Disposition: A | Discharge status: Home / Self Care | Payer: Self-pay | Source: Ambulatory Visit | Attending: Gastroenterology

## 2021-07-31 ENCOUNTER — Ambulatory Visit: Payer: BC Managed Care – PPO | Admitting: Anesthesiology

## 2021-07-31 ENCOUNTER — Ambulatory Visit
Admission: RE | Admit: 2021-07-31 | Discharge: 2021-07-31 | Disposition: A | Discharge status: Home / Self Care | Payer: BC Managed Care – PPO | Source: Ambulatory Visit | Attending: Gastroenterology | Admitting: Gastroenterology

## 2021-07-31 DIAGNOSIS — E119 Type 2 diabetes mellitus without complications: Secondary | ICD-10-CM | POA: Insufficient documentation

## 2021-07-31 DIAGNOSIS — I10 Essential (primary) hypertension: Secondary | ICD-10-CM | POA: Insufficient documentation

## 2021-07-31 DIAGNOSIS — Z1211 Encounter for screening for malignant neoplasm of colon: Secondary | ICD-10-CM

## 2021-07-31 DIAGNOSIS — Z7984 Long term (current) use of oral hypoglycemic drugs: Secondary | ICD-10-CM | POA: Insufficient documentation

## 2021-07-31 DIAGNOSIS — F419 Anxiety disorder, unspecified: Secondary | ICD-10-CM | POA: Insufficient documentation

## 2021-07-31 DIAGNOSIS — J45909 Unspecified asthma, uncomplicated: Secondary | ICD-10-CM | POA: Insufficient documentation

## 2021-07-31 DIAGNOSIS — K644 Residual hemorrhoidal skin tags: Secondary | ICD-10-CM | POA: Diagnosis not present

## 2021-07-31 DIAGNOSIS — Z8249 Family history of ischemic heart disease and other diseases of the circulatory system: Secondary | ICD-10-CM | POA: Diagnosis not present

## 2021-07-31 DIAGNOSIS — Z833 Family history of diabetes mellitus: Secondary | ICD-10-CM | POA: Insufficient documentation

## 2021-07-31 HISTORY — PX: COLONOSCOPY WITH PROPOFOL: SHX5780

## 2021-07-31 LAB — POCT PREGNANCY, URINE: Preg Test, Ur: NEGATIVE

## 2021-07-31 SURGERY — COLONOSCOPY WITH PROPOFOL
Anesthesia: General

## 2021-07-31 MED ORDER — SODIUM CHLORIDE 0.9 % IV SOLN: INTRAVENOUS | Status: DC

## 2021-07-31 MED ORDER — PROPOFOL 500 MG/50ML IV EMUL
INTRAVENOUS | Status: DC | PRN
  Administered 2021-07-31: 140 ug/kg/min via INTRAVENOUS

## 2021-07-31 MED ORDER — LIDOCAINE HCL (CARDIAC) PF 100 MG/5ML IV SOSY
PREFILLED_SYRINGE | INTRAVENOUS | Status: DC | PRN
  Administered 2021-07-31: 100 mg via INTRAVENOUS

## 2021-07-31 MED ORDER — PROPOFOL 10 MG/ML IV BOLUS
INTRAVENOUS | Status: DC | PRN
  Administered 2021-07-31: 80 mg via INTRAVENOUS

## 2021-07-31 MED ORDER — DEXMEDETOMIDINE (PRECEDEX) IN NS 20 MCG/5ML (4 MCG/ML) IV SYRINGE
PREFILLED_SYRINGE | INTRAVENOUS | Status: DC | PRN
  Administered 2021-07-31: 8 ug via INTRAVENOUS
  Administered 2021-07-31: 4 ug via INTRAVENOUS

## 2021-07-31 NOTE — Anesthesia Preprocedure Evaluation (Addendum)
Anesthesia Evaluation  ?Patient identified by MRN, date of birth, ID band ?Patient awake ? ? ? ?Reviewed: ?Allergy & Precautions, NPO status , Patient's Chart, lab work & pertinent test results ? ?Airway ?Mallampati: III ? ?TM Distance: >3 FB ?Neck ROM: Full ? ? ? Dental ? ?(+) Missing ?  ?Pulmonary ?asthma ,  ?  ?Pulmonary exam normal ?breath sounds clear to auscultation ? ? ? ? ? ? Cardiovascular ?hypertension, Pt. on medications ?Normal cardiovascular exam ?Rhythm:Regular Rate:Normal ? ? ?  ?Neuro/Psych ?PSYCHIATRIC DISORDERS Anxiety negative neurological ROS ?   ? GI/Hepatic ?negative GI ROS, Neg liver ROS,   ?Endo/Other  ?diabetes, Well Controlled, Type 2, Oral Hypoglycemic Agents ? Renal/GU ?negative Renal ROS  ?negative genitourinary ?  ?Musculoskeletal ?negative musculoskeletal ROS ?(+)  ? Abdominal ?(+) + obese,   ?Peds ?negative pediatric ROS ?(+)  Hematology ?negative hematology ROS ?(+)   ?Anesthesia Other Findings ? ? Reproductive/Obstetrics ?negative OB ROS ? ?  ? ? ? ? ? ? ? ? ? ? ? ? ? ?  ?  ? ? ? ? ? ? ? ? ?Anesthesia Physical ?Anesthesia Plan ? ?ASA: 3 ? ?Anesthesia Plan: General  ? ?Post-op Pain Management:   ? ?Induction: Intravenous ? ?PONV Risk Score and Plan: Propofol infusion and TIVA ? ?Airway Management Planned: Natural Airway and Simple Face Mask ? ?Additional Equipment:  ? ?Intra-op Plan:  ? ?Post-operative Plan:  ? ?Informed Consent: I have reviewed the patients History and Physical, chart, labs and discussed the procedure including the risks, benefits and alternatives for the proposed anesthesia with the patient or authorized representative who has indicated his/her understanding and acceptance.  ? ? ? ?Dental advisory given ? ?Plan Discussed with: CRNA ? ?Anesthesia Plan Comments:   ? ? ? ? ? ? ?Anesthesia Quick Evaluation ? ?

## 2021-07-31 NOTE — Op Note (Signed)
[facility] ?Gastroenterology ?Patient Name: Destiny West ?Procedure Date: 07/31/2021 11:08 AM ?MRN: [id number] ?Account #: [id number] ?Date of Birth: Sep 13, 1970 ?Admit Type: Outpatient ?Age: 51 ?Room: [facility] ENDO ROOM 4 ?Gender: Female ?Note Status: Finalized ?Instrument Name: Colonoscope [id number] ?Procedure:             Colonoscopy ?Indications:           Screening for colorectal malignant neoplasm, This is  ?                       the patient's first colonoscopy ?Providers:             Lin Landsman MD, MD ?Medicines:             General Anesthesia ?Complications:         No immediate complications. Estimated blood loss: None. ?Procedure:             Pre-Anesthesia Assessment: ?                       - Prior to the procedure, a History and Physical was  ?                       performed, and patient medications and allergies were  ?                       reviewed. The patient is competent. The risks and  ?                       benefits of the procedure and the sedation options and  ?                       risks were discussed with the patient. All questions  ?                       were answered and informed consent was obtained.  ?                       Patient identification and proposed procedure were  ?                       verified by the physician, the nurse, the  ?                       anesthesiologist, the anesthetist and the technician  ?                       in the pre-procedure area in the procedure room in the  ?                       endoscopy suite. Mental Status Examination: alert and  ?                       oriented. Airway Examination: normal oropharyngeal  ?                       airway and neck mobility. Respiratory Examination:  ?                       clear to auscultation.  CV Examination: normal.  ?                       Prophylactic Antibiotics: The patient does not require  ?                       prophylactic antibiotics. Prior Anticoagulants: The  ?                        patient has taken no previous anticoagulant or  ?                       antiplatelet agents. ASA Grade Assessment: III - A  ?                       patient with severe systemic disease. After reviewing  ?                       the risks and benefits, the patient was deemed in  ?                       satisfactory condition to undergo the procedure. The  ?                       anesthesia plan was to use general anesthesia.  ?                       Immediately prior to administration of medications,  ?                       the patient was re-assessed for adequacy to receive  ?                       sedatives. The heart rate, respiratory rate, oxygen  ?                       saturations, blood pressure, adequacy of pulmonary  ?                       ventilation, and response to care were monitored  ?                       throughout the procedure. The physical status of the  ?                       patient was re-assessed after the procedure. ?                       After obtaining informed consent, the colonoscope was  ?                       passed under direct vision. Throughout the procedure,  ?                       the patient's blood pressure, pulse, and oxygen  ?                       saturations were monitored continuously. The  ?  Colonoscope was introduced through the anus and  ?                       advanced to the the cecum, identified by appendiceal  ?                       orifice and ileocecal valve. The colonoscopy was  ?                       performed without difficulty. The patient tolerated  ?                       the procedure well. The quality of the bowel  ?                       preparation was evaluated using the BBPS [facility] Bowel  ?                       Preparation Scale) with scores of: Right Colon = 3,  ?                       Transverse Colon = 3 and Left Colon = 3 (entire mucosa  ?                       seen well with no residual staining, small fragments   ?                       of stool or opaque liquid). The total BBPS score  ?                       equals 9. ?Findings: ?     The perianal and digital rectal examinations were normal. Pertinent  ?     negatives include normal sphincter tone and no palpable rectal lesions. ?     The entire examined colon appeared normal. ?     Non-bleeding external hemorrhoids were found during retroflexion. The  ?     hemorrhoids were small. ?     The exam was otherwise without abnormality. ?Impression:            - The entire examined colon is normal. ?                       - Non-bleeding external hemorrhoids. ?                       - The examination was otherwise normal. ?                       - No specimens collected. ?Recommendation:        - Discharge patient to home (with escort). ?                       - Resume previous diet today. ?                       - Continue present medications. ?                       - Repeat colonoscopy in 10 years  for screening  ?                       purposes. ?Procedure Code(s):     --- Professional --- ?                       W9794, Colorectal cancer screening; colonoscopy on  ?                       individual not meeting criteria for high risk ?Diagnosis Code(s):     --- Professional --- ?                       Z12.11, Encounter for screening for malignant neoplasm  ?                       of colon ?                       K64.4, Residual hemorrhoidal skin tags ?CPT copyright 2019 American Medical Association. All rights reserved. ?The codes documented in this report are preliminary and upon coder review may  ?be revised to meet current compliance requirements. ?Dr. Ulyess Mort ?Danille Oppedisano Raeanne Gathers MD, MD ?07/31/2021 11:35:02 AM ?This report has been signed electronically. ?Number of Addenda: 0 ?Note Initiated On: 07/31/2021 11:08 AM ?Scope Withdrawal Time: 0 hours 9 minutes 14 seconds  ?Total Procedure Duration: 0 hours 11 minutes 42 seconds  ?Estimated Blood Loss:  Estimated blood loss:  none. ?     [facility] ?

## 2021-07-31 NOTE — Transfer of Care (Signed)
Immediate Anesthesia Transfer of Care Note ? ?Patient: Destiny West ? ?Procedure(s) Performed: COLONOSCOPY WITH PROPOFOL ? ?Patient Location: PACU ? ?Anesthesia Type:General ? ?Level of Consciousness: awake, alert  and oriented ? ?Airway & Oxygen Therapy: Patient Spontanous Breathing ? ?Post-op Assessment: Report given to RN and Post -op Vital signs reviewed and stable ? ?Post vital signs: Reviewed and stable ? ?Last Vitals:  ?Vitals Value Taken Time  ?BP 90/63 07/31/21 1139  ?Temp 35.8 ?C 07/31/21 1137  ?Pulse 61 07/31/21 1141  ?Resp 14 07/31/21 1141  ?SpO2 96 % 07/31/21 1141  ?Vitals shown include unvalidated device data. ? ?Last Pain:  ?Vitals:  ? 07/31/21 1137  ?TempSrc: Temporal  ?PainSc: 0-No pain  ?   ? ?  ? ?Complications: No notable events documented. ?

## 2021-07-31 NOTE — H&P (Signed)
?Cephas Darby, MD ?[address]  ?[address]  ?[place], [place] [address]  ?Main: [phone]  ?Fax: [phone] ?Pager: [phone] ? ?Primary Care Physician:  Pleas Koch, NP ?Primary Gastroenterologist:  Dr. Cephas Darby ? ?Pre-Procedure History & Physical: ?HPI:  Destiny West is a 51 y.o. female is here for an colonoscopy. ?  ?Past Medical History:  ?Diagnosis Date  ? Anxiety   ? Asthma   ? Chicken pox   ? Diabetes mellitus without complication ([place])   ? boderline  ? Hyperlipidemia   ? Hypertension   ? ? ?Past Surgical History:  ?Procedure Laterality Date  ? BREAST LUMPECTOMY WITH RADIO FREQUENCY LOCALIZER Left 05/20/2021  ? Procedure: BREAST LUMPECTOMY WITH RADIO FREQUENCY LOCALIZER;  Surgeon: Ronny Bacon, MD;  Location: [facility] ORS;  Service: General;  Laterality: Left;  ? CATARACT EXTRACTION W/PHACO Left 06/10/2021  ? Procedure: CATARACT EXTRACTION PHACO AND INTRAOCULAR LENS PLACEMENT ([place]) LEFT DIABETIC 2.52 00:28.0;  Surgeon: Leandrew Koyanagi, MD;  Location: [facility];  Service: Ophthalmology;  Laterality: Left;  ? POLYPECTOMY  2012  ? Uterine  ? ? ?Prior to Admission medications   ?Medication Sig Start Date End Date Taking? Authorizing Provider  ?amLODipine (NORVASC) 5 MG tablet Take 1 tablet (5 mg total) by mouth daily. For blood pressure. Office visit required for further refills. 05/18/21  Yes Pleas Koch, NP  ?Cholecalciferol (VITAMIN D) 50 MCG (2000 UT) tablet Take 4,000 Units by mouth daily.   Yes [provider]  ?hydrochlorothiazide (HYDRODIURIL) 12.5 MG tablet TAKE 2 TABLETS (25 MG TOTAL) BY MOUTH DAILY. FOR BLOOD PRESSURE. 07/08/21  Yes Pleas Koch, NP  ?losartan (COZAAR) 100 MG tablet TAKE 1 TABLET BY MOUTH EVERY DAY FOR BLOOD PRESSURE 07/24/21  Yes Pleas Koch, NP  ?metFORMIN (GLUCOPHAGE) 1000 MG tablet Take 1 tablet (1,000 mg total) by mouth 2 (two) times daily with a meal. For diabetes. Office visit required for further refills.  05/18/21  Yes Pleas Koch, NP  ?Omega-3 Fatty Acids (FISH OIL) 1000 MG CAPS Take 1,000 mg by mouth in the morning and at bedtime.   Yes [provider]  ?rosuvastatin (CRESTOR) 10 MG tablet Take 1 tablet (10 mg total) by mouth every evening. For cholesterol. Office visit required for further refills. 05/18/21  Yes Pleas Koch, NP  ?sertraline (ZOLOFT) 100 MG tablet TAKE 1 TABLET (100 MG TOTAL) BY MOUTH DAILY. FOR ANXIETY 06/08/21  Yes Pleas Koch, NP  ?valACYclovir (VALTREX) 500 MG tablet Take 500 mg by mouth daily. 04/29/20  Yes [provider]  ?albuterol (PROAIR HFA) 108 (90 Base) MCG/ACT inhaler Inhale 2 puffs into the lungs every 6 (six) hours as needed for wheezing or shortness of breath. 06/05/18   Pleas Koch, NP  ?DUREZOL 0.05 % EMUL Place 1 drop into the left eye every other day. 12/17/19   [provider]  ?simvastatin (ZOCOR) 20 MG tablet TAKE 1 TABLET BY MOUTH EVERY DAY IN THE EVENING 01/03/18 06/05/18  Pleas Koch, NP  ? ? ?Allergies as of 06/16/2021  ? (No Known Allergies)  ? ? ?Family History  ?Problem Relation Age of Onset  ? Cancer Mother   ?     breast  ? Breast cancer Mother 27  ? Heart disease Father   ? Hypertension Father   ? Heart disease Maternal Grandfather   ? Hypertension Maternal Grandfather   ? Diabetes Maternal Grandfather   ? ? ?Social History  ? ?Socioeconomic  History  ? Marital status: Married  ?  Spouse name: Not on file  ? Number of children: Not on file  ? Years of education: Not on file  ? Highest education level: Not on file  ?Occupational History  ? Not on file  ?Tobacco Use  ? Smoking status: Never  ? Smokeless tobacco: Never  ?Vaping Use  ? Vaping Use: Never used  ?Substance and Sexual Activity  ? Alcohol use: Yes  ?  Alcohol/week: 0.0 standard drinks  ?  Comment: rarely  ? Drug use: Never  ? Sexual activity: Not on file  ?Other Topics Concern  ? Not on file  ?Social History Narrative  ? Married.  ? No children.  ? Works as  a Pharmacist, hospital at [employer]  ? 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  ? Enjoys reading, traveling, movies.  ? ?Social Determinants of Health  ? ?Financial Resource Strain: Not on file  ?Food Insecurity: Not on file  ?Transportation Needs: Not on file  ?Physical Activity: Not on file  ?Stress: Not on file  ?Social Connections: Not on file  ?Intimate Partner Violence: Not on file  ? ? ?Review of Systems: ?See HPI, otherwise negative ROS ? ?Physical Exam: ?BP 120/73   Pulse 88   Temp (!) 96.9 ?F (36.1 ?C) (Temporal)   Resp 19   Ht 5' (1.524 m)   Wt 107 kg   SpO2 96%   BMI 46.09 kg/m?  ?General:   Alert,  pleasant and cooperative in NAD ?Head:  Normocephalic and atraumatic. ?Neck:  Supple; no masses or thyromegaly. ?Lungs:  Clear throughout to auscultation.    ?Heart:  Regular rate and rhythm. ?Abdomen:  Soft, nontender and nondistended. Normal bowel sounds, without guarding, and without rebound.   ?Neurologic:  Alert and  oriented x4;  grossly normal neurologically. ? ?Impression/Plan: ?[name] is here for an colonoscopy to be performed for colon cancer screening ? ?Risks, benefits, limitations, and alternatives regarding  colonoscopy have been reviewed with the patient.  Questions have been answered.  All parties agreeable. ? ? ?Sherri Sear, MD  07/31/2021, 10:36 AM ?

## 2021-07-31 NOTE — Anesthesia Postprocedure Evaluation (Signed)
Anesthesia Post Note ? ?Patient: Destiny West ? ?Procedure(s) Performed: COLONOSCOPY WITH PROPOFOL ? ?Patient location during evaluation: PACU ?Anesthesia Type: General ?Level of consciousness: awake and alert ?Pain management: pain level controlled ?Vital Signs Assessment: post-procedure vital signs reviewed and stable ?Respiratory status: spontaneous breathing, nonlabored ventilation and respiratory function stable ?Cardiovascular status: blood pressure returned to baseline and stable ?Postop Assessment: no apparent nausea or vomiting ?Anesthetic complications: no ? ? ?No notable events documented. ? ? ?Last Vitals:  ?Vitals:  ? 07/31/21 1137 07/31/21 1139  ?BP: (!) 89/53 90/63  ?Pulse: 72 69  ?Resp: 14 15  ?Temp: (!) 35.8 ?C   ?SpO2: 90% 96%  ?  ?Last Pain:  ?Vitals:  ? 07/31/21 1159  ?TempSrc:   ?PainSc: 0-No pain  ? ? ?  ?  ?  ?  ?  ?  ? ?[place] Ouch ? ? ? ? ?

## 2021-08-03 ENCOUNTER — Encounter: Payer: Self-pay | Admitting: Gastroenterology

## 2021-08-10 ENCOUNTER — Other Ambulatory Visit: Payer: Self-pay | Admitting: Primary Care

## 2021-08-10 DIAGNOSIS — E785 Hyperlipidemia, unspecified: Secondary | ICD-10-CM

## 2021-08-10 DIAGNOSIS — E119 Type 2 diabetes mellitus without complications: Secondary | ICD-10-CM

## 2021-08-20 ENCOUNTER — Other Ambulatory Visit: Payer: Self-pay | Admitting: Primary Care

## 2021-08-20 DIAGNOSIS — I10 Essential (primary) hypertension: Secondary | ICD-10-CM

## 2021-09-05 ENCOUNTER — Other Ambulatory Visit: Payer: Self-pay | Admitting: Primary Care

## 2021-09-05 DIAGNOSIS — F411 Generalized anxiety disorder: Secondary | ICD-10-CM

## 2021-09-15 ENCOUNTER — Other Ambulatory Visit: Payer: Self-pay

## 2021-09-15 DIAGNOSIS — D242 Benign neoplasm of left breast: Secondary | ICD-10-CM

## 2021-11-18 ENCOUNTER — Ambulatory Visit
Admission: RE | Admit: 2021-11-18 | Discharge: 2021-11-18 | Disposition: A | Discharge status: Home / Self Care | Payer: BC Managed Care – PPO | Source: Ambulatory Visit | Attending: Surgery | Admitting: Surgery

## 2021-11-18 ENCOUNTER — Other Ambulatory Visit: Payer: BC Managed Care – PPO

## 2021-11-18 DIAGNOSIS — D242 Benign neoplasm of left breast: Secondary | ICD-10-CM | POA: Insufficient documentation

## 2021-12-08 ENCOUNTER — Ambulatory Visit (INDEPENDENT_AMBULATORY_CARE_PROVIDER_SITE_OTHER): Payer: BC Managed Care – PPO | Admitting: Surgery

## 2021-12-08 ENCOUNTER — Encounter: Payer: Self-pay | Admitting: Surgery

## 2021-12-08 VITALS — BP 142/85 | HR 95 | Temp 98.0°F | Ht 60.0 in | Wt 246.0 lb

## 2021-12-08 DIAGNOSIS — D242 Benign neoplasm of left breast: Secondary | ICD-10-CM

## 2021-12-08 NOTE — Patient Instructions (Addendum)
Next mammogram is due in November. We will contact you in October 2023 to schedule a mammogram and breast exam or you may choose to have your PCP follow up with you.

## 2021-12-08 NOTE — Progress Notes (Unsigned)
Surgical Clinic Progress/Follow-up Note   HPI:  51 y.o. Female presents to clinic for left intraductal papilloma excision.  Follow-up with 35-monthfollow-up diagnostic imaging the left breast.  Patient reports no breast pain, new issues with skin changes or nipple discharge.  Improvement/resolution of prior issues.   Review of Systems:  Constitutional: denies fever/chills  ENT: denies sore throat, hearing problems  Respiratory: denies shortness of breath, wheezing  Cardiovascular: denies chest pain, palpitations  Gastrointestinal: denies abdominal pain, N/V, or diarrhea/and bowel function as per interval history Skin: Denies any other rashes or skin discolorations as per interval history  Vital Signs:  BP (!) 142/85   Pulse 95   Temp 98 F (36.7 C)   Ht 5' (1.524 m)   Wt 246 lb (111.6 kg)   SpO2 96%   BMI 48.04 kg/m    Physical Exam:  Constitutional:  -- Obese body habitus  -- Awake, alert, and oriented x3  Pulmonary:  -- No crackles -- Equal breath sounds bilaterally -- Breathing non-labored at rest Cardiovascular:  -- S1, S2 present  -- No pericardial rubs  Gastrointestinal:  -- Soft and non-distended, non-tender. GU  --SFreda Munropresent as chaperone.  Bilateral breast exam is unremarkable for any suspicious or dominant nodularity.  Prior surgical site on the left is well-healed without any evidence of persistent seroma or residual mass. Musculoskeletal / Integumentary:  -- Wounds or skin discoloration: None appreciated  -- Extremities: B/L UE and LE FROM, hands and feet warm, no edema   Imaging:  CLINICAL DATA:  Status post LEFT breast excision for papilloma   EXAM: DIGITAL DIAGNOSTIC UNILATERAL LEFT MAMMOGRAM WITH TOMOSYNTHESIS AND CAD   TECHNIQUE: Left digital diagnostic mammography and breast tomosynthesis was performed. The images were evaluated with computer-aided detection.   COMPARISON:  Previous exam(s).   ACR Breast Density Category b: There are  scattered areas of fibroglandular density.   FINDINGS: Interval excision of LEFT breast RF ID tag and VENUS clip. There are corresponding postsurgical changes noted in the outer breast. No new suspicious findings are noted. No suspicious mass, distortion, or microcalcifications are identified to suggest presence of malignancy.   IMPRESSION: No mammographic evidence of malignancy.   RECOMMENDATION: Recommend return to annual screening mammography, due November 2023.   I have discussed the findings and recommendations with the patient. If applicable, a reminder letter will be sent to the patient regarding the next appointment.   BI-RADS CATEGORY  2: Benign.     Electronically Signed   By: SValentino SaxonM.D.   On: 11/18/2021 16:02   Assessment:  51y.o. yo Female with a problem list including...  Patient Active Problem List   Diagnosis Date Noted   Colon cancer screening    Intraductal papilloma of left breast 04/14/2021   Sciatica without back pain, right 12/09/2020   Herpes zoster ophthalmicus of left eye 06/11/2020   Screening for cervical cancer 11/30/2018   Morbid obesity with BMI of 45.0-49.9, adult ([place] 06/03/2017   Preventative health care 05/09/2015   Type 2 diabetes mellitus ([place] 11/26/2014   Essential hypertension 11/26/2014   Hyperlipidemia 11/26/2014   GAD (generalized anxiety disorder) 11/26/2014   Asthma in adult without complication 029/56/2130   presents to clinic for follow-up evaluation of excisional biopsy left breast, progressing well.  Plan:              - return to clinic annually or as needed, instructed to call office if any questions or concerns.  We will  follow-up her next set of screening mammography, and call her back if there is any new abnormality.  All of the above recommendations were discussed with the patient's, and all of patient's questions were answered to her expressed satisfaction.  These notes generated with voice  recognition software. I apologize for typographical errors.  Ronny Bacon, MD, FACS [place]: [facility] exceptional care. Office: [phone]

## 2021-12-15 ENCOUNTER — Encounter: Payer: Self-pay | Admitting: Primary Care

## 2021-12-15 ENCOUNTER — Other Ambulatory Visit: Payer: Self-pay | Admitting: Primary Care

## 2021-12-15 ENCOUNTER — Ambulatory Visit (INDEPENDENT_AMBULATORY_CARE_PROVIDER_SITE_OTHER): Payer: BC Managed Care – PPO | Admitting: Primary Care

## 2021-12-15 VITALS — BP 134/68 | HR 93 | Temp 98.6°F | Ht 60.0 in | Wt 246.0 lb

## 2021-12-15 DIAGNOSIS — Z23 Encounter for immunization: Secondary | ICD-10-CM

## 2021-12-15 DIAGNOSIS — Z6841 Body Mass Index (BMI) 40.0 and over, adult: Secondary | ICD-10-CM

## 2021-12-15 DIAGNOSIS — E1165 Type 2 diabetes mellitus with hyperglycemia: Secondary | ICD-10-CM

## 2021-12-15 DIAGNOSIS — J4520 Mild intermittent asthma, uncomplicated: Secondary | ICD-10-CM | POA: Diagnosis not present

## 2021-12-15 LAB — POCT GLYCOSYLATED HEMOGLOBIN (HGB A1C): Hemoglobin A1C: 7 % — AB (ref 4.0–5.6)

## 2021-12-15 MED ORDER — ALBUTEROL SULFATE HFA 108 (90 BASE) MCG/ACT IN AERS: 2.0000 | INHALATION_SPRAY | Freq: Four times a day (QID) | RESPIRATORY_TRACT | 0 refills | Status: AC | PRN

## 2021-12-15 MED ORDER — TIRZEPATIDE 2.5 MG/0.5ML ~~LOC~~ SOAJ: 2.5000 mg | SUBCUTANEOUS | 0 refills | Status: DC

## 2021-12-15 NOTE — Addendum Note (Signed)
Addended by: Francella Solian on: 12/15/2021 02:43 PM   Modules accepted: Orders

## 2021-12-15 NOTE — Patient Instructions (Signed)
Start Mounjaro 2.5 mg weekly for a total of 4 weeks, then message me once you use your last pen so we can increase your dose to 5 mg thereafter.  Continue metformin.  Work on your snacking and overall diet. Start exercising if possible.   Schedule a lab appointment for 3 months and your physical with me in 6 months.  It was a pleasure to see you today!

## 2021-12-15 NOTE — Assessment & Plan Note (Signed)
Overall controlled with A1C of 7.0. Would like to see her below 7, she agrees.  Continue metformin 1000 mg BID. Add Mounjaro 2.5 mg weekly x 4 weeks, then increase to 5 mg weekly thereafter.  Foot exam today. Pneumovax provided today. Eye exam UTD. Managed on statin and ABR.  Repeat A1C in 3 months, follow up in 6 months.

## 2021-12-15 NOTE — Assessment & Plan Note (Signed)
Refill provided for albuterol inhaler. Discussed to notify if she requires use more than 3 times weekly

## 2021-12-15 NOTE — Assessment & Plan Note (Signed)
Discussed the importance of a healthy diet and regular exercise in order for weight loss, and to reduce the risk of further co-morbidity.  Will start Mounjaro for diabetes and also benefits of weight loss. Discussed potential side effects and instructions for use.

## 2021-12-15 NOTE — Progress Notes (Signed)
Subjective:    Patient ID: Destiny West, female    DOB: 1970-11-19, 51 y.o.   MRN: [id number]  Wheezing  Pertinent negatives include no chest pain, headaches or shortness of breath.  Diabetes Pertinent negatives for hypoglycemia include no dizziness or headaches. Pertinent negatives for diabetes include no chest pain.    Destiny West is a very pleasant 51 y.o. female with a history of type 2 diabetes, hypertension, asthma, hyperlipidemia who presents today for follow up of diabetes. She is also requesting a refill of her albuterol inhaler.   1) Type 2 Diabtes: Current medications include: metformin 1000 mg BID   Last A1C: 7.3 in February 2023, 7.0 today  Last Eye Exam: UTD Last Foot Exam: Due Pneumonia Vaccination: Never completed  Urine Microalbumin: None. Managed on ARB Statin: rosuvastatin   Dietary changes since last visit: Lean protein, increased veggies. She is working to cut back on snacking.    Exercise: No regular exercise.   She is interested in the injectable medications for diabetes and weight loss. She has struggled with her weight for years. She snacks a lot at night, is aware of this problem and is trying to cut back.    BP Readings from Last 3 Encounters:  12/15/21 134/68  12/08/21 (!) 142/85  07/31/21 90/63    Wt Readings from Last 3 Encounters:  12/15/21 246 lb (111.6 kg)  12/08/21 246 lb (111.6 kg)  07/31/21 236 lb (107 kg)      Review of Systems  Eyes:  Positive for visual disturbance.  Respiratory:  Positive for wheezing. Negative for chest tightness and shortness of breath.   Cardiovascular:  Negative for chest pain.  Neurological:  Negative for dizziness, numbness and headaches.         Past Medical History:  Diagnosis Date   Anxiety    Asthma    Chicken pox    Diabetes mellitus without complication ([place])    boderline   Hyperlipidemia    Hypertension     Social History   Socioeconomic History   Marital status: Married     Spouse name: Not on file   Number of children: Not on file   Years of education: Not on file   Highest education level: Not on file  Occupational History   Not on file  Tobacco Use   Smoking status: Never    Passive exposure: Past   Smokeless tobacco: Never  Vaping Use   Vaping Use: Never used  Substance and Sexual Activity   Alcohol use: Yes    Alcohol/week: 0.0 standard drinks of alcohol    Comment: rarely   Drug use: Never   Sexual activity: Not on file  Other Topics Concern   Not on file  Social History Narrative   Married.   No children.   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   Enjoys reading, traveling, movies.   Social Determinants of Health   Financial Resource Strain: Not on file  Food Insecurity: Not on file  Transportation Needs: Not on file  Physical Activity: Not on file  Stress: Not on file  Social Connections: Not on file  Intimate Partner Violence: Not on file    Past Surgical History:  Procedure Laterality Date   BREAST LUMPECTOMY WITH RADIO FREQUENCY LOCALIZER Left 05/20/2021   Procedure: BREAST LUMPECTOMY WITH RADIO FREQUENCY LOCALIZER;  Surgeon: Ronny Bacon, MD;  Location: [facility] ORS;  Service: General;  Laterality: Left;  CATARACT EXTRACTION W/PHACO Left 06/10/2021   Procedure: CATARACT EXTRACTION PHACO AND INTRAOCULAR LENS PLACEMENT (IOC) LEFT DIABETIC 2.52 00:28.0;  Surgeon: Leandrew Koyanagi, MD;  Location: [place];  Service: Ophthalmology;  Laterality: Left;   COLONOSCOPY WITH PROPOFOL N/A 07/31/2021   Procedure: COLONOSCOPY WITH PROPOFOL;  Surgeon: Lin Landsman, MD;  Location: [facility] ENDOSCOPY;  Service: Gastroenterology;  Laterality: N/A;   POLYPECTOMY  2012   Uterine    Family History  Problem Relation Age of Onset   Cancer Mother        breast   Breast cancer Mother 74   Heart disease Father    Hypertension Father    Heart disease Maternal Grandfather    Hypertension Maternal  Grandfather    Diabetes Maternal Grandfather     No Known Allergies  Current Outpatient Medications on File Prior to Visit  Medication Sig Dispense Refill   amLODipine (NORVASC) 5 MG tablet Take 1 tablet (5 mg total) by mouth daily. for blood pressure. 90 tablet 2   Cholecalciferol (VITAMIN D) 50 MCG (2000 UT) tablet Take 4,000 Units by mouth daily.     DUREZOL 0.05 % EMUL Place 1 drop into the left eye every other day.     hydrochlorothiazide (HYDRODIURIL) 12.5 MG tablet TAKE 2 TABLETS (25 MG TOTAL) BY MOUTH DAILY. FOR BLOOD PRESSURE. 180 tablet 3   losartan (COZAAR) 100 MG tablet TAKE 1 TABLET BY MOUTH EVERY DAY FOR BLOOD PRESSURE 90 tablet 2   metFORMIN (GLUCOPHAGE) 1000 MG tablet Take 1 tablet (1,000 mg total) by mouth 2 (two) times daily with a meal. for diabetes. 180 tablet 1   Omega-3 Fatty Acids (FISH OIL) 1000 MG CAPS Take 1,000 mg by mouth in the morning and at bedtime.     rosuvastatin (CRESTOR) 10 MG tablet Take 1 tablet (10 mg total) by mouth daily. for cholesterol. 90 tablet 2   sertraline (ZOLOFT) 100 MG tablet TAKE 1 TABLET (100 MG TOTAL) BY MOUTH DAILY. FOR ANXIETY 90 tablet 2   valACYclovir (VALTREX) 500 MG tablet Take 500 mg by mouth daily.     [DISCONTINUED] simvastatin (ZOCOR) 20 MG tablet TAKE 1 TABLET BY MOUTH EVERY DAY IN THE EVENING 90 tablet 1   No current facility-administered medications on file prior to visit.    BP 134/68   Pulse 93   Temp 98.6 F (37 C) (Oral)   Ht 5' (1.524 m)   Wt 246 lb (111.6 kg)   SpO2 99%   BMI 48.04 kg/m  Objective:   Physical Exam Cardiovascular:     Rate and Rhythm: Normal rate and regular rhythm.  Pulmonary:     Effort: Pulmonary effort is normal.     Breath sounds: Normal breath sounds.  Musculoskeletal:     Cervical back: Neck supple.  Skin:    General: Skin is warm and dry.           Assessment & Plan:   Problem List Items Addressed This Visit       Respiratory   Asthma in adult without complication     Refill provided for albuterol inhaler. Discussed to notify if she requires use more than 3 times weekly       Relevant Medications   albuterol (PROAIR HFA) 108 (90 Base) MCG/ACT inhaler     Endocrine   Type 2 diabetes mellitus with hyperglycemia ([place]) - Primary    Overall controlled with A1C of 7.0. Would like to see her below 7, she agrees.  Continue metformin 1000 mg BID. Add Mounjaro 2.5 mg weekly x 4 weeks, then increase to 5 mg weekly thereafter.  Foot exam today. Pneumovax provided today. Eye exam UTD. Managed on statin and ABR.  Repeat A1C in 3 months, follow up in 6 months.      Relevant Medications   tirzepatide (MOUNJARO) 2.5 MG/0.5ML Pen   Other Relevant Orders   POCT glycosylated hemoglobin (Hb A1C) (Completed)     Other   Morbid obesity with BMI of 45.0-49.9, adult ([place])    Discussed the importance of a healthy diet and regular exercise in order for weight loss, and to reduce the risk of further co-morbidity.  Will start Mounjaro for diabetes and also benefits of weight loss. Discussed potential side effects and instructions for use.       Relevant Medications   tirzepatide (MOUNJARO) 2.5 MG/0.5ML Pen   Other Visit Diagnoses     Mild intermittent asthma without complication       Relevant Medications   albuterol (PROAIR HFA) 108 (90 Base) MCG/ACT inhaler          Pleas Koch, NP

## 2021-12-18 LAB — HM DIABETES EYE EXAM

## 2022-01-12 ENCOUNTER — Other Ambulatory Visit: Payer: Self-pay | Admitting: Primary Care

## 2022-01-12 DIAGNOSIS — J4520 Mild intermittent asthma, uncomplicated: Secondary | ICD-10-CM

## 2022-01-13 ENCOUNTER — Other Ambulatory Visit: Payer: Self-pay

## 2022-01-13 DIAGNOSIS — Z1231 Encounter for screening mammogram for malignant neoplasm of breast: Secondary | ICD-10-CM

## 2022-01-18 LAB — HM DIABETES EYE EXAM

## 2022-02-05 ENCOUNTER — Other Ambulatory Visit: Payer: Self-pay | Admitting: Primary Care

## 2022-02-05 DIAGNOSIS — E119 Type 2 diabetes mellitus without complications: Secondary | ICD-10-CM

## 2022-03-05 ENCOUNTER — Other Ambulatory Visit: Payer: Self-pay | Admitting: Primary Care

## 2022-03-05 DIAGNOSIS — E1165 Type 2 diabetes mellitus with hyperglycemia: Secondary | ICD-10-CM

## 2022-03-12 ENCOUNTER — Ambulatory Visit
Admission: RE | Admit: 2022-03-12 | Discharge: 2022-03-12 | Disposition: A | Discharge status: Home / Self Care | Payer: BC Managed Care – PPO | Source: Ambulatory Visit | Attending: Surgery | Admitting: Surgery

## 2022-03-12 DIAGNOSIS — Z1231 Encounter for screening mammogram for malignant neoplasm of breast: Secondary | ICD-10-CM | POA: Diagnosis present

## 2022-03-16 ENCOUNTER — Other Ambulatory Visit: Payer: Self-pay | Admitting: Surgery

## 2022-03-16 DIAGNOSIS — R928 Other abnormal and inconclusive findings on diagnostic imaging of breast: Secondary | ICD-10-CM

## 2022-03-16 DIAGNOSIS — N6489 Other specified disorders of breast: Secondary | ICD-10-CM

## 2022-03-17 ENCOUNTER — Other Ambulatory Visit (INDEPENDENT_AMBULATORY_CARE_PROVIDER_SITE_OTHER): Payer: BC Managed Care – PPO

## 2022-03-17 DIAGNOSIS — E1165 Type 2 diabetes mellitus with hyperglycemia: Secondary | ICD-10-CM | POA: Diagnosis not present

## 2022-03-17 LAB — POCT GLYCOSYLATED HEMOGLOBIN (HGB A1C): Hemoglobin A1C: 6.3 % — AB (ref 4.0–5.6)

## 2022-03-20 ENCOUNTER — Other Ambulatory Visit: Payer: Self-pay | Admitting: Primary Care

## 2022-03-20 DIAGNOSIS — E1165 Type 2 diabetes mellitus with hyperglycemia: Secondary | ICD-10-CM

## 2022-03-23 ENCOUNTER — Other Ambulatory Visit: Payer: Self-pay

## 2022-03-23 ENCOUNTER — Ambulatory Visit (INDEPENDENT_AMBULATORY_CARE_PROVIDER_SITE_OTHER): Payer: BC Managed Care – PPO | Admitting: Surgery

## 2022-03-23 ENCOUNTER — Encounter: Payer: Self-pay | Admitting: Surgery

## 2022-03-23 VITALS — BP 109/78 | HR 88 | Temp 98.6°F | Ht 60.0 in | Wt 226.0 lb

## 2022-03-23 DIAGNOSIS — Z1231 Encounter for screening mammogram for malignant neoplasm of breast: Secondary | ICD-10-CM

## 2022-03-23 DIAGNOSIS — D242 Benign neoplasm of left breast: Secondary | ICD-10-CM

## 2022-03-23 NOTE — Patient Instructions (Addendum)
Keep the Mammogram appointment scheduled in December.  We will contact you Novemeber 2024 to schedule mammogram and follow up appointment with Dr.Rodenberg.

## 2022-03-23 NOTE — Progress Notes (Signed)
Surgical Clinic Progress/Follow-up Note   HPI:  51 y.o. Female presents to clinic for follow-up after left intraductal papilloma excision.  Follow-up with screening bilateral imaging November 10, unfortunately a degree of possible asymmetry was noted and a desire for diagnostic imaging as requested by the radiologist.  She has diagnostic imaging scheduled for December 6.  Patient reports no breast pain, new issues with skin changes or nipple discharge.  Improvement/resolution of prior issues.   Review of Systems:  Constitutional: denies fever/chills  ENT: denies sore throat, hearing problems  Respiratory: denies shortness of breath, wheezing  Cardiovascular: denies chest pain, palpitations  Gastrointestinal: denies abdominal pain, N/V, or diarrhea/and bowel function as per interval history Skin: Denies any other rashes or skin discolorations as per interval history  Vital Signs:  BP 109/78   Pulse 88   Temp 98.6 F (37 C) (Oral)   Ht 5' (1.524 m)   Wt 226 lb (102.5 kg)   SpO2 95%   BMI 44.14 kg/m    Physical Exam:  Constitutional:  -- Obese body habitus  -- Awake, alert, and oriented x3  Pulmonary:  -- No crackles -- Equal breath sounds bilaterally -- Breathing non-labored at rest Cardiovascular:  -- S1, S2 present  -- No pericardial rubs  Gastrointestinal:  -- Soft and non-distended, non-tender. GU  --Freda Munro present as chaperone.  Bilateral breast exam is unremarkable for any suspicious or dominant nodularity.  Prior surgical site on the left is well-healed without any evidence of persistent seroma or residual mass. Musculoskeletal / Integumentary:  -- Wounds or skin discoloration: None appreciated  -- Extremities: B/L UE and LE FROM, hands and feet warm, no edema   Imaging:  CLINICAL DATA:  Status post LEFT breast excision for papilloma   EXAM: DIGITAL DIAGNOSTIC UNILATERAL LEFT MAMMOGRAM WITH TOMOSYNTHESIS AND CAD   TECHNIQUE: Left digital diagnostic mammography  and breast tomosynthesis was performed. The images were evaluated with computer-aided detection.   COMPARISON:  Previous exam(s).   ACR Breast Density Category b: There are scattered areas of fibroglandular density.   FINDINGS: Interval excision of LEFT breast RF ID tag and VENUS clip. There are corresponding postsurgical changes noted in the outer breast. No new suspicious findings are noted. No suspicious mass, distortion, or microcalcifications are identified to suggest presence of malignancy.   IMPRESSION: No mammographic evidence of malignancy.   RECOMMENDATION: Recommend return to annual screening mammography, due November 2023.   I have discussed the findings and recommendations with the patient. If applicable, a reminder letter will be sent to the patient regarding the next appointment.   BI-RADS CATEGORY  2: Benign.     Electronically Signed   By: Valentino Saxon M.D.   On: 11/18/2021 16:02  CLINICAL DATA:  Screening.   EXAM: DIGITAL SCREENING BILATERAL MAMMOGRAM WITH TOMOSYNTHESIS AND CAD   TECHNIQUE: Bilateral screening digital craniocaudal and mediolateral oblique mammograms were obtained. Bilateral screening digital breast tomosynthesis was performed. The images were evaluated with computer-aided detection.   COMPARISON:  Previous exam(s).   ACR Breast Density Category b: There are scattered areas of fibroglandular density.   FINDINGS: In the left breast, a possible asymmetry warrants further evaluation. In the right breast, no findings suspicious for malignancy.   IMPRESSION: Further evaluation is suggested for possible asymmetry in the left breast.   RECOMMENDATION: Diagnostic mammogram and possibly ultrasound of the left breast. (Code:FI-L-80M)   The patient will be contacted regarding the findings, and additional imaging will be scheduled.   BI-RADS CATEGORY  0: Incomplete. Need additional imaging evaluation and/or prior mammograms  for comparison.     Electronically Signed   By: Lovey Newcomer M.D.   On: 03/16/2022 14:13 Assessment:  51 y.o. yo Female with a problem list including...  Patient Active Problem List   Diagnosis Date Noted   Colon cancer screening    Intraductal papilloma of left breast 04/14/2021   Sciatica without back pain, right 12/09/2020   Herpes zoster ophthalmicus of left eye 06/11/2020   Screening for cervical cancer 11/30/2018   Morbid obesity with BMI of 45.0-49.9, adult ([place]) 06/03/2017   Preventative health care 05/09/2015   Type 2 diabetes mellitus with hyperglycemia ([place]) 11/26/2014   Essential hypertension 11/26/2014   Hyperlipidemia 11/26/2014   GAD (generalized anxiety disorder) 11/26/2014   Asthma in adult without complication 41/07/129    presents to clinic for follow-up evaluation of excisional biopsy left breast, progressing well.  Plan:  -Advised to proceed with repeated diagnostic imaging of the left breast due to the possible asymmetry noted on her screening evaluation.  Should this change our course of screening, or frequency of diagnostic imaging we will just follow-up accordingly.             - return to clinic annually or as needed, instructed to call office if any questions or concerns.  We will follow-up her next set of screening mammography, and call her back if there is any new abnormality.  All of the above recommendations were discussed with the patient's, and all of patient's questions were answered to her expressed satisfaction.  These notes generated with voice recognition software. I apologize for typographical errors.  Ronny Bacon, MD, FACS [place]: [place] [facility]. Office: [phone]

## 2022-04-07 ENCOUNTER — Ambulatory Visit
Admission: RE | Admit: 2022-04-07 | Discharge: 2022-04-07 | Disposition: A | Discharge status: Home / Self Care | Payer: BC Managed Care – PPO | Source: Ambulatory Visit | Attending: Surgery | Admitting: Surgery

## 2022-04-07 DIAGNOSIS — R928 Other abnormal and inconclusive findings on diagnostic imaging of breast: Secondary | ICD-10-CM | POA: Diagnosis not present

## 2022-04-07 DIAGNOSIS — N6489 Other specified disorders of breast: Secondary | ICD-10-CM | POA: Diagnosis present

## 2022-04-20 ENCOUNTER — Other Ambulatory Visit: Payer: Self-pay | Admitting: Primary Care

## 2022-04-20 DIAGNOSIS — I10 Essential (primary) hypertension: Secondary | ICD-10-CM

## 2022-05-01 ENCOUNTER — Other Ambulatory Visit: Payer: Self-pay | Admitting: Primary Care

## 2022-05-01 DIAGNOSIS — E785 Hyperlipidemia, unspecified: Secondary | ICD-10-CM

## 2022-05-01 DIAGNOSIS — E119 Type 2 diabetes mellitus without complications: Secondary | ICD-10-CM

## 2022-05-14 ENCOUNTER — Other Ambulatory Visit: Payer: Self-pay | Admitting: Primary Care

## 2022-05-14 DIAGNOSIS — I10 Essential (primary) hypertension: Secondary | ICD-10-CM

## 2022-06-04 ENCOUNTER — Other Ambulatory Visit: Payer: Self-pay | Admitting: Primary Care

## 2022-06-04 DIAGNOSIS — F411 Generalized anxiety disorder: Secondary | ICD-10-CM

## 2022-06-22 ENCOUNTER — Ambulatory Visit (INDEPENDENT_AMBULATORY_CARE_PROVIDER_SITE_OTHER): Payer: BC Managed Care – PPO | Admitting: Primary Care

## 2022-06-22 ENCOUNTER — Other Ambulatory Visit: Payer: Self-pay | Admitting: Primary Care

## 2022-06-22 ENCOUNTER — Encounter: Payer: Self-pay | Admitting: Primary Care

## 2022-06-22 VITALS — BP 124/80 | HR 86 | Temp 98.2°F | Ht 60.0 in | Wt 221.0 lb

## 2022-06-22 DIAGNOSIS — J4520 Mild intermittent asthma, uncomplicated: Secondary | ICD-10-CM

## 2022-06-22 DIAGNOSIS — F411 Generalized anxiety disorder: Secondary | ICD-10-CM

## 2022-06-22 DIAGNOSIS — E1165 Type 2 diabetes mellitus with hyperglycemia: Secondary | ICD-10-CM

## 2022-06-22 DIAGNOSIS — I10 Essential (primary) hypertension: Secondary | ICD-10-CM

## 2022-06-22 DIAGNOSIS — Z8619 Personal history of other infectious and parasitic diseases: Secondary | ICD-10-CM | POA: Diagnosis not present

## 2022-06-22 DIAGNOSIS — Z0001 Encounter for general adult medical examination with abnormal findings: Secondary | ICD-10-CM

## 2022-06-22 DIAGNOSIS — E785 Hyperlipidemia, unspecified: Secondary | ICD-10-CM

## 2022-06-22 LAB — LIPID PANEL
Cholesterol: 170 mg/dL (ref 0–200)
HDL: 50.2 mg/dL (ref >39.00)
LDL Cholesterol: 87 mg/dL (ref 0–99)
NonHDL: 119.81
Total CHOL/HDL Ratio: 3
Triglycerides: 162 mg/dL — ABNORMAL HIGH (ref 0.0–149.0)
VLDL: 32.4 mg/dL (ref 0.0–40.0)

## 2022-06-22 LAB — COMPREHENSIVE METABOLIC PANEL
ALT: 18 U/L (ref 0–35)
AST: 20 U/L (ref 0–37)
Albumin: 4.5 g/dL (ref 3.5–5.2)
Alkaline Phosphatase: 47 U/L (ref 39–117)
BUN: 14 mg/dL (ref 6–23)
CO2: 30 mEq/L (ref 19–32)
Calcium: 9.9 mg/dL (ref 8.4–10.5)
Chloride: 100 mEq/L (ref 96–112)
Creatinine, Ser: 0.69 mg/dL (ref 0.40–1.20)
GFR: 101.72 mL/min (ref >60.00)
Glucose, Bld: 113 mg/dL — ABNORMAL HIGH (ref 70–99)
Potassium: 4.1 mEq/L (ref 3.5–5.1)
Sodium: 140 mEq/L (ref 135–145)
Total Bilirubin: 0.3 mg/dL (ref 0.2–1.2)
Total Protein: 7.8 g/dL (ref 6.0–8.3)

## 2022-06-22 LAB — MICROALBUMIN / CREATININE URINE RATIO
Creatinine,U: 128.5 mg/dL
Microalb Creat Ratio: 1.4 mg/g (ref 0.0–30.0)
Microalb, Ur: 1.8 mg/dL (ref 0.0–1.9)

## 2022-06-22 LAB — HEMOGLOBIN A1C: Hgb A1c MFr Bld: 6.3 % (ref 4.6–6.5)

## 2022-06-22 MED ORDER — METFORMIN HCL ER 500 MG PO TB24: 500.0000 mg | ORAL_TABLET | Freq: Every day | ORAL | 1 refills | Status: DC

## 2022-06-22 MED ORDER — SEMAGLUTIDE (1 MG/DOSE) 4 MG/3ML ~~LOC~~ SOPN: 1.0000 mg | PEN_INJECTOR | SUBCUTANEOUS | 1 refills | Status: DC

## 2022-06-22 MED ORDER — SERTRALINE HCL 25 MG PO TABS: 25.0000 mg | ORAL_TABLET | Freq: Every day | ORAL | 0 refills | Status: DC

## 2022-06-22 NOTE — Assessment & Plan Note (Signed)
Repeat A1C pending.  Continue metformin 1000 mg BID. Increase Ozempic to 1 mg weekly for weight loss and diabetes.  Urine microalbumin due and pending.  Follow up in 6 months.

## 2022-06-22 NOTE — Assessment & Plan Note (Signed)
Immunizations UTD. Declines influenza vaccine. Pap smear due, declines today. Mammogram UTD. Colonoscopy UTD, due 2033  Discussed the importance of a healthy diet and regular exercise in order for weight loss, and to reduce the risk of further co-morbidity.  Exam stable. Labs pending.  Follow up in 1 year for repeat physical.

## 2022-06-22 NOTE — Patient Instructions (Signed)
We increased your Zoloft dose to 125 mg daily. Take the extra 25 mg tablet with 100 mg.   We increased the dose of your Ozempic to 1 mg weekly for diabetes.  Please update me in 1 month as discussed.   Please schedule a follow up visit for 6 months for a diabetes check.  Stop by the lab prior to leaving today. I will notify you of your results once received.   It was a pleasure to see you today!

## 2022-06-22 NOTE — Assessment & Plan Note (Signed)
Uncontrolled, see HPI.   Discussed options including switching SSRI's to increasing Zoloft dose. Will start by increasing Zoloft dose to 125 mg daily. She will update in a few weeks. Consider buspirone.  She will consider intermittent FMLA and update.

## 2022-06-22 NOTE — Assessment & Plan Note (Signed)
Following with ophthalmology. Continue Valtrex 500 mg daily.

## 2022-06-22 NOTE — Progress Notes (Signed)
Subjective:    Patient ID: Destiny West, female    DOB: 1970/05/23, 52 y.o.   MRN: [id number]  HPI  Destiny West is a very pleasant 52 y.o. female who presents today for complete physical and follow up of chronic conditions.  She would like to mention anxiety/depression. Chronic symptoms for years. Feels stressed with work and home life. She is managed on Zoloft 100 mg daily which historically helps. Over the last 4-6 months symptoms have worsened. Symptoms include feeling down/sad, constant worrying, constant over thinking things, mind racing thoughts. She is intermittent medical leave for her ongoing symptoms of anxiety/depression.   Immunizations: -Tetanus: Completed in 2017 -Influenza: Did not complete this season  -Pneumonia: 2023  Diet: [place].  Exercise: No regular exercise.  Eye exam: Completes annually  Dental exam: Completes semi-annually   Pap Smear: Completed in July 2020, declines today.  Mammogram: Completed in December 2023  Colonoscopy: Completed in 2023, due 2033  BP Readings from Last 3 Encounters:  06/22/22 124/80  03/23/22 109/78  12/15/21 134/68     Wt Readings from Last 3 Encounters:  06/22/22 221 lb (100.2 kg)  03/23/22 226 lb (102.5 kg)  12/15/21 246 lb (111.6 kg)         Review of Systems  Constitutional:  Negative for unexpected weight change.  HENT:  Negative for rhinorrhea.   Respiratory:  Negative for cough and shortness of breath.   Cardiovascular:  Negative for chest pain.  Gastrointestinal:  Negative for constipation and diarrhea.  Genitourinary:  Negative for difficulty urinating.  Musculoskeletal:  Negative for arthralgias.  Skin:  Negative for rash.  Allergic/Immunologic: Negative for environmental allergies.  Neurological:  Negative for dizziness and headaches.  Psychiatric/Behavioral:  The patient is nervous/anxious.          Past Medical History:  Diagnosis Date   Anxiety    Asthma    Chicken pox     Diabetes mellitus without complication ([place])    boderline   Hyperlipidemia    Hypertension     Social History   Socioeconomic History   Marital status: Married    Spouse name: Not on file   Number of children: Not on file   Years of education: Not on file   Highest education level: Not on file  Occupational History   Not on file  Tobacco Use   Smoking status: Never    Passive exposure: Past   Smokeless tobacco: Never  Vaping Use   Vaping Use: Never used  Substance and Sexual Activity   Alcohol use: Yes    Alcohol/week: 0.0 standard drinks of alcohol    Comment: rarely   Drug use: Never   Sexual activity: Not on file  Other Topics Concern   Not on file  Social History Narrative   Married.   No children.   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   Enjoys reading, traveling, movies.   Social Determinants of Health   Financial Resource Strain: Not on file  Food Insecurity: Not on file  Transportation Needs: Not on file  Physical Activity: Not on file  Stress: Not on file  Social Connections: Not on file  Intimate Partner Violence: Not on file    Past Surgical History:  Procedure Laterality Date   BREAST LUMPECTOMY WITH RADIO FREQUENCY LOCALIZER Left 05/20/2021   Procedure: BREAST LUMPECTOMY WITH RADIO FREQUENCY LOCALIZER;  Surgeon: Ronny Bacon, MD;  Location: [facility] ORS;  Service: General;  Laterality: Left;   CATARACT EXTRACTION W/PHACO Left 06/10/2021   Procedure: CATARACT EXTRACTION PHACO AND INTRAOCULAR LENS PLACEMENT (IOC) LEFT DIABETIC 2.52 00:28.0;  Surgeon: Leandrew Koyanagi, MD;  Location: [place];  Service: Ophthalmology;  Laterality: Left;   COLONOSCOPY WITH PROPOFOL N/A 07/31/2021   Procedure: COLONOSCOPY WITH PROPOFOL;  Surgeon: Lin Landsman, MD;  Location: [facility] ENDOSCOPY;  Service: Gastroenterology;  Laterality: N/A;   POLYPECTOMY  2012   Uterine    Family History  Problem Relation Age of Onset    Cancer Mother        breast   Breast cancer Mother 48   Heart disease Father    Hypertension Father    Heart disease Maternal Grandfather    Hypertension Maternal Grandfather    Diabetes Maternal Grandfather     No Known Allergies  Current Outpatient Medications on File Prior to Visit  Medication Sig Dispense Refill   albuterol (PROAIR HFA) 108 (90 Base) MCG/ACT inhaler Inhale 2 puffs into the lungs every 6 (six) hours as needed for wheezing or shortness of breath. 1 each 0   amLODipine (NORVASC) 5 MG tablet TAKE 1 TABLET BY MOUTH EVERY DAY FOR BLOOD PRESSURE 90 tablet 0   Cholecalciferol (VITAMIN D) 50 MCG (2000 UT) tablet Take 4,000 Units by mouth daily.     DUREZOL 0.05 % EMUL Place 1 drop into the left eye every other day.     hydrochlorothiazide (HYDRODIURIL) 12.5 MG tablet TAKE 2 TABLETS (25 MG TOTAL) BY MOUTH DAILY. FOR BLOOD PRESSURE. 180 tablet 3   losartan (COZAAR) 100 MG tablet TAKE 1 TABLET BY MOUTH EVERY DAY FOR BLOOD PRESSURE 90 tablet 0   metFORMIN (GLUCOPHAGE) 1000 MG tablet TAKE 1 TABLET (1,000 MG TOTAL) BY MOUTH 2 (TWO) TIMES DAILY WITH A MEAL. FOR DIABETES. 180 tablet 0   Omega-3 Fatty Acids (FISH OIL) 1000 MG CAPS Take 1,000 mg by mouth in the morning and at bedtime.     rosuvastatin (CRESTOR) 10 MG tablet TAKE 1 TABLET BY MOUTH DAILY. FOR CHOLESTEROL 90 tablet 0   sertraline (ZOLOFT) 100 MG tablet TAKE 1 TABLET (100 MG TOTAL) BY MOUTH DAILY. FOR ANXIETY 90 tablet 0   valACYclovir (VALTREX) 500 MG tablet Take 500 mg by mouth daily.     [DISCONTINUED] simvastatin (ZOCOR) 20 MG tablet TAKE 1 TABLET BY MOUTH EVERY DAY IN THE EVENING 90 tablet 1   No current facility-administered medications on file prior to visit.    BP 124/80   Pulse 86   Temp 98.2 F (36.8 C) (Temporal)   Ht 5' (1.524 m)   Wt 221 lb (100.2 kg)   LMP 04/07/2022 (Approximate)   SpO2 100%   BMI 43.16 kg/m  Objective:   Physical Exam HENT:     Right Ear: Tympanic membrane and ear canal  normal.     Left Ear: Tympanic membrane and ear canal normal.     Nose: Nose normal.  Eyes:     Conjunctiva/sclera: Conjunctivae normal.     Pupils: Pupils are equal, round, and reactive to light.  Neck:     Thyroid: No thyromegaly.  Cardiovascular:     Rate and Rhythm: Normal rate and regular rhythm.     Heart sounds: No murmur heard. Pulmonary:     Effort: Pulmonary effort is normal.     Breath sounds: Normal breath sounds. No rales.  Abdominal:     General: Bowel sounds are normal.     Palpations: Abdomen is soft.  Tenderness: There is no abdominal tenderness.  Musculoskeletal:        General: Normal range of motion.     Cervical back: Neck supple.  Lymphadenopathy:     Cervical: No cervical adenopathy.  Skin:    General: Skin is warm and dry.     Findings: No rash.  Neurological:     Mental Status: She is alert and oriented to person, place, and time.     Cranial Nerves: No cranial nerve deficit.     Deep Tendon Reflexes: Reflexes are normal and symmetric.  Psychiatric:        Mood and Affect: Mood normal.           Assessment & Plan:  Encounter for annual general medical examination with abnormal findings in adult Assessment & Plan: Immunizations UTD. Declines influenza vaccine. Pap smear due, declines today. Mammogram UTD. Colonoscopy UTD, due 2033  Discussed the importance of a healthy diet and regular exercise in order for weight loss, and to reduce the risk of further co-morbidity.  Exam stable. Labs pending.  Follow up in 1 year for repeat physical.    Essential hypertension Assessment & Plan: Controlled.  Continue amlodipine 5 mg daily, losartan losartan 100 mg daily, HCTZ 12.5 mg daily. CMP pending.   Orders: -     Comprehensive metabolic panel  Mild intermittent asthma in adult without complication Assessment & Plan: Asymptomatic.  Continue albuterol inhaler PRN.   History of herpes zoster of eye Assessment & Plan: Following  with ophthalmology. Continue Valtrex 500 mg daily.   GAD (generalized anxiety disorder) Assessment & Plan: Uncontrolled, see HPI.   Discussed options including switching SSRI's to increasing Zoloft dose. Will start by increasing Zoloft dose to 125 mg daily. She will update in a few weeks. Consider buspirone.  She will consider intermittent FMLA and update.   Orders: -     Sertraline HCl; Take 1 tablet (25 mg total) by mouth daily. for anxiety and depression. Take with 100 mg dose.  Dispense: 90 tablet; Refill: 0  Hyperlipidemia, unspecified hyperlipidemia type Assessment & Plan: Continue rosuvastatin 10 mg daily. Repeat lipid panel pending.  Orders: -     Lipid panel  Type 2 diabetes mellitus with hyperglycemia, without long-term current use of insulin ([facility]) Assessment & Plan: Repeat A1C pending.  Continue metformin 1000 mg BID. Increase Ozempic to 1 mg weekly for weight loss and diabetes.  Urine microalbumin due and pending.  Follow up in 6 months.  Orders: -     Microalbumin / creatinine urine ratio -     Semaglutide (1 MG/DOSE); Inject 1 mg as directed once a week. for diabetes.  Dispense: 9 mL; Refill: 1 -     Hemoglobin A1c        Pleas Koch, NP

## 2022-06-22 NOTE — Assessment & Plan Note (Signed)
Continue rosuvastatin 10 mg daily. ?Repeat lipid panel pending. ?

## 2022-06-22 NOTE — Assessment & Plan Note (Signed)
Asymptomatic.  Continue albuterol inhaler PRN.

## 2022-06-22 NOTE — Assessment & Plan Note (Signed)
Controlled.  Continue amlodipine 5 mg daily, losartan losartan 100 mg daily, HCTZ 12.5 mg daily. CMP pending.

## 2022-07-02 ENCOUNTER — Other Ambulatory Visit: Payer: Self-pay | Admitting: Primary Care

## 2022-07-02 DIAGNOSIS — I10 Essential (primary) hypertension: Secondary | ICD-10-CM

## 2022-07-02 NOTE — Telephone Encounter (Signed)
Unable to reach patient. Left voicemail to return call to our office.   

## 2022-07-02 NOTE — Telephone Encounter (Signed)
Please call patient:  Can we clarify that she is taking 2 of the hydrochlorothiazide BP pills daily? If so, does she want for me to send in the 25 mg dose so she only has to take one daily?

## 2022-07-02 NOTE — Telephone Encounter (Signed)
Pt called back returning Kelli's missed. Told pt Clark's response, pt states she is taking 2 tablets daily of hydrochlorothiazide & would like if Clark could send in 25 mg.

## 2022-07-17 ENCOUNTER — Other Ambulatory Visit: Payer: Self-pay | Admitting: Primary Care

## 2022-07-17 DIAGNOSIS — I10 Essential (primary) hypertension: Secondary | ICD-10-CM

## 2022-07-29 ENCOUNTER — Other Ambulatory Visit: Payer: Self-pay | Admitting: Primary Care

## 2022-07-29 DIAGNOSIS — E119 Type 2 diabetes mellitus without complications: Secondary | ICD-10-CM

## 2022-07-29 DIAGNOSIS — E785 Hyperlipidemia, unspecified: Secondary | ICD-10-CM

## 2022-08-10 NOTE — Telephone Encounter (Signed)
FMLA for Patient forms received for completion for patient. Patient has been informed that process may take up to 5 business days.  Employer Name: [employer] Reason for being out: Panic attacks Any inpatient care: no Any Planned appointment? No, next appt 12/21/2022 Patient is requesting start date of 08/16/22 Patient is requesting end date of 09/28/2022 Continuous or Intermittent: Continuous  Verified with patient that it is ok to leave Voicemail updates on  Mobile [phone] (mobile)  Patient would like to pick up copy in our office when form is completed.   Form instructs to return to patient instead of faxing directly to department of labor.   Form placed in your inbox

## 2022-08-10 NOTE — Telephone Encounter (Signed)
Completed and placed in Kelli's inbox. 

## 2022-08-12 ENCOUNTER — Other Ambulatory Visit: Payer: Self-pay | Admitting: Primary Care

## 2022-08-12 DIAGNOSIS — I10 Essential (primary) hypertension: Secondary | ICD-10-CM

## 2022-08-19 DIAGNOSIS — E1165 Type 2 diabetes mellitus with hyperglycemia: Secondary | ICD-10-CM

## 2022-08-19 MED ORDER — SEMAGLUTIDE (2 MG/DOSE) 8 MG/3ML ~~LOC~~ SOPN
2.0000 mg | PEN_INJECTOR | SUBCUTANEOUS | 0 refills | Status: DC
Start: 2022-08-19 — End: 2022-11-11

## 2022-08-30 ENCOUNTER — Other Ambulatory Visit: Payer: Self-pay | Admitting: Primary Care

## 2022-08-30 DIAGNOSIS — F411 Generalized anxiety disorder: Secondary | ICD-10-CM

## 2022-09-17 ENCOUNTER — Other Ambulatory Visit: Payer: Self-pay | Admitting: Primary Care

## 2022-09-17 DIAGNOSIS — F411 Generalized anxiety disorder: Secondary | ICD-10-CM

## 2022-09-21 ENCOUNTER — Other Ambulatory Visit: Payer: Self-pay | Admitting: Primary Care

## 2022-09-21 DIAGNOSIS — E1165 Type 2 diabetes mellitus with hyperglycemia: Secondary | ICD-10-CM

## 2022-11-10 ENCOUNTER — Other Ambulatory Visit: Payer: Self-pay | Admitting: Primary Care

## 2022-11-10 DIAGNOSIS — E1165 Type 2 diabetes mellitus with hyperglycemia: Secondary | ICD-10-CM

## 2022-11-11 ENCOUNTER — Encounter: Payer: Self-pay | Admitting: Primary Care

## 2022-11-11 ENCOUNTER — Ambulatory Visit: Payer: BC Managed Care – PPO | Admitting: Primary Care

## 2022-11-11 VITALS — BP 128/70 | HR 85 | Temp 98.0°F | Ht 60.0 in | Wt 214.6 lb

## 2022-11-11 DIAGNOSIS — Z6841 Body Mass Index (BMI) 40.0 and over, adult: Secondary | ICD-10-CM

## 2022-11-11 DIAGNOSIS — E1165 Type 2 diabetes mellitus with hyperglycemia: Secondary | ICD-10-CM | POA: Diagnosis not present

## 2022-11-11 DIAGNOSIS — Z7985 Long-term (current) use of injectable non-insulin antidiabetic drugs: Secondary | ICD-10-CM | POA: Diagnosis not present

## 2022-11-11 LAB — POCT GLYCOSYLATED HEMOGLOBIN (HGB A1C): Hemoglobin A1C: 5.9 % — AB (ref 4.0–5.6)

## 2022-11-11 MED ORDER — TIRZEPATIDE 2.5 MG/0.5ML ~~LOC~~ SOAJ
2.5000 mg | SUBCUTANEOUS | 0 refills | Status: DC
Start: 2022-11-11 — End: 2022-12-07

## 2022-11-11 NOTE — Progress Notes (Signed)
Subjective:    Patient ID: Destiny West, female    DOB: 08/18/1970, 52 y.o.   MRN: [id number]  HPI  Destiny West is a very pleasant 52 y.o. female with a history of hypertension, type 2 diabetes, asthma, hyperlipidemia who presents today for follow-up of diabetes and obesity.  Currently managed on semaglutide 2 mg weekly for diabetes and obesity.  A few weeks ago she reached out to our team reporting a stagnation in her weight loss despite dietary changes and exercise.  She is looking for other options.  Last A1c was 6.3 in February 2024.  She is weighing herself every few days and is seeing numbers 210-213 pounds since March 2024. Now she doesn't feel very full when eating, but she is trying not to overeat.   Diet currently consists of:  Breakfast: Skips sometimes, yogurt with fruit or bread with avocado or scrambled egg whites  Lunch: Bean salad with veggies with a protein, sometimes  Dinner: Salad, fish/shrimp, chicken, veggie  Snacks: None  Desserts: One weekly   Beverages: Water, diet soda twice weekly   Exercise: Yoga at home, walking on the treadmill, strength training at home   Wt Readings from Last 3 Encounters:  11/11/22 214 lb 9.6 oz (97.3 kg)  06/22/22 221 lb (100.2 kg)  03/23/22 226 lb (102.5 kg)      Review of Systems  Respiratory:  Negative for shortness of breath.   Cardiovascular:  Negative for chest pain.  Gastrointestinal:  Negative for nausea.  Neurological:  Negative for dizziness and numbness.         Past Medical History:  Diagnosis Date   Anxiety    Asthma    Chicken pox    Diabetes mellitus without complication (HCC)    boderline   Hyperlipidemia    Hypertension     Social History   Socioeconomic History   Marital status: Married    Spouse name: Not on file   Number of children: Not on file   Years of education: Not on file   Highest education level: Bachelor's degree (e.g., BA, AB, BS)  Occupational History   Not on  file  Tobacco Use   Smoking status: Never    Passive exposure: Past   Smokeless tobacco: Never  Vaping Use   Vaping status: Never Used  Substance and Sexual Activity   Alcohol use: Yes    Alcohol/week: 0.0 standard drinks of alcohol    Comment: rarely   Drug use: Never   Sexual activity: Not on file  Other Topics Concern   Not on file  Social History Narrative   Married.   No children.   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   Enjoys reading, traveling, movies.   Social Determinants of Health   Financial Resource Strain: Low Risk  (11/10/2022)   Overall Financial Resource Strain (CARDIA)    Difficulty of Paying Living Expenses: Not very hard  Food Insecurity: No Food Insecurity (11/10/2022)   Hunger Vital Sign    Worried About Running Out of Food in the Last Year: Never true    Ran Out of Food in the Last Year: Never true  Transportation Needs: No Transportation Needs (11/10/2022)   PRAPARE - Administrator, Civil Service (Medical): No    Lack of Transportation (Non-Medical): No  Physical Activity: Sufficiently Active (11/10/2022)   Exercise Vital Sign    Days of Exercise per Week: 5 days  Minutes of Exercise per Session: 60 min  Stress: Stress Concern Present (11/10/2022)   [employer] of Occupational Health - Occupational Stress Questionnaire    Feeling of Stress : To some extent  Social Connections: Moderately Integrated (11/10/2022)   Social Connection and Isolation Panel [NHANES]    Frequency of Communication with Friends and Family: Three times a week    Frequency of Social Gatherings with Friends and Family: Never    Attends Religious Services: 1 to 4 times per year    Active Member of [employer] or Organizations: No    Attends Engineer, structural: Not on file    Marital Status: Married  Catering manager Violence: Not on file    Past Surgical History:  Procedure Laterality Date   BREAST LUMPECTOMY WITH RADIO  FREQUENCY LOCALIZER Left 05/20/2021   Procedure: BREAST LUMPECTOMY WITH RADIO FREQUENCY LOCALIZER;  Surgeon: Campbell Lerner, MD;  Location: [facility] ORS;  Service: General;  Laterality: Left;   CATARACT EXTRACTION W/PHACO Left 06/10/2021   Procedure: CATARACT EXTRACTION PHACO AND INTRAOCULAR LENS PLACEMENT (IOC) LEFT DIABETIC 2.52 00:28.0;  Surgeon: Lockie Mola, MD;  Location: [facility];  Service: Ophthalmology;  Laterality: Left;   COLONOSCOPY WITH PROPOFOL N/A 07/31/2021   Procedure: COLONOSCOPY WITH PROPOFOL;  Surgeon: Toney Reil, MD;  Location: [facility] END[facility]COPY;  Service: Gastroenterology;  Laterality: N/A;   POLYPECTOMY  2012   Uterine    Family History  Problem Relation Age of Onset   Cancer Mother        breast   Breast cancer Mother 38   Heart disease Father    Hypertension Father    Heart disease Maternal Grandfather    Hypertension Maternal Grandfather    Diabetes Maternal Grandfather     No Known Allergies  Current Outpatient Medications on File Prior to Visit  Medication Sig Dispense Refill   albuterol (PROAIR HFA) 108 (90 Base) MCG/ACT inhaler Inhale 2 puffs into the lungs every 6 (six) hours as needed for wheezing or shortness of breath. 1 each 0   amLODipine (NORVASC) 5 MG tablet TAKE 1 TABLET BY MOUTH EVERY DAY FOR BLOOD PRESSURE 90 tablet 2   Cholecalciferol (VITAMIN D) 50 MCG (2000 UT) tablet Take 4,000 Units by mouth daily.     DUREZOL 0.05 % EMUL Place 1 drop into the left eye every other day.     hydrochlorothiazide (HYDRODIURIL) 25 MG tablet Take 1 tablet (25 mg total) by mouth daily. for blood pressure. 90 tablet 3   losartan (COZAAR) 100 MG tablet TAKE 1 TABLET BY MOUTH EVERY DAY FOR BLOOD PRESSURE 90 tablet 2   metFORMIN (GLUCOPHAGE-XR) 500 MG 24 hr tablet Take 1 tablet (500 mg total) by mouth daily with breakfast. for diabetes. 90 tablet 1   Omega-3 Fatty Acids (FISH OIL) 1000 MG CAPS Take 1,000 mg by mouth in the morning and at bedtime.      rosuvastatin (CRESTOR) 10 MG tablet TAKE 1 TABLET BY MOUTH EVERY DAY FOR CHOLESTEROL 90 tablet 2   sertraline (ZOLOFT) 100 MG tablet TAKE 1 TABLET (100 MG TOTAL) BY MOUTH DAILY. FOR ANXIETY 90 tablet 2   sertraline (ZOLOFT) 25 MG tablet TAKE 1 TABLET (25 MG TOTAL) BY MOUTH DAILY. FOR ANXIETY AND DEPRESSION. TAKE WITH 100 MG DOSE. 90 tablet 2   valACYclovir (VALTREX) 500 MG tablet Take 500 mg by mouth daily.     [DISCONTINUED] simvastatin (ZOCOR) 20 MG tablet TAKE 1 TABLET BY MOUTH EVERY DAY IN THE EVENING 90 tablet  1   No current facility-administered medications on file prior to visit.    BP 128/70   Pulse 85   Temp 98 F (36.7 C) (Temporal)   Ht 5' (1.524 m)   Wt 214 lb 9.6 oz (97.3 kg)   SpO2 97%   BMI 41.91 kg/m  Objective:   Physical Exam Cardiovascular:     Rate and Rhythm: Normal rate and regular rhythm.  Pulmonary:     Effort: Pulmonary effort is normal.     Breath sounds: Normal breath sounds.  Musculoskeletal:     Cervical back: Neck supple.  Skin:    General: Skin is warm and dry.           Assessment & Plan:  Type 2 diabetes mellitus with hyperglycemia, without long-term current use of insulin (HCC) Assessment & Plan: Well-controlled with A1c of 5.9 today!  Given lack of continued weight loss with Ozempic, we will switch to [facility].  Stop Ozempic 2 mg weekly. Start Mounjaro 2.5 mg weekly x 4 weeks, then increase to 5 mg weekly and titrate upward thereafter.  Follow-up in 6 months.  Orders: -     POCT glycosylated hemoglobin (Hb A1C) -     Tirzepatide; Inject 2.5 mg into the skin once a week. for diabetes.  Dispense: 2 mL; Refill: 0  Class 3 severe obesity due to excess calories with serious comorbidity and body mass index (BMI) of 40.0 to 44.9 in adult [facility]) Assessment & Plan: Commended her on weight loss and improvements in her diet! Encouraged to continue!  Will switch to [facility] to see if this helps with more weight loss. Start with 2.5  mg weekly, then proceed with 5 mg weekly and titrate up from there.         Doreene Nest, NP

## 2022-11-11 NOTE — Assessment & Plan Note (Signed)
Commended her on weight loss and improvements in her diet! Encouraged to continue!  Will switch to [facility] to see if this helps with more weight loss. Start with 2.5 mg weekly, then proceed with 5 mg weekly and titrate up from there.

## 2022-11-11 NOTE — Assessment & Plan Note (Signed)
Well-controlled with A1c of 5.9 today!  Given lack of continued weight loss with Ozempic, we will switch to [facility].  Stop Ozempic 2 mg weekly. Start Mounjaro 2.5 mg weekly x 4 weeks, then increase to 5 mg weekly and titrate upward thereafter.  Follow-up in 6 months.

## 2022-11-11 NOTE — Patient Instructions (Signed)
Start tirzepitide Destiny West) for diabetes/weight loss. Start by injecting 2.5 mg into the skin once weekly for 4 weeks, then increase to 5 mg once weekly thereafter. Please notify me once you've used your last 2.5 mg pen so that I can prescribe the next dose.   Please schedule a physical to meet with me in late February 2025.   It was a pleasure to see you today!

## 2022-12-07 DIAGNOSIS — E1165 Type 2 diabetes mellitus with hyperglycemia: Secondary | ICD-10-CM

## 2022-12-07 MED ORDER — TIRZEPATIDE 5 MG/0.5ML ~~LOC~~ SOAJ
5.0000 mg | SUBCUTANEOUS | 0 refills | Status: DC
Start: 2022-12-07 — End: 2023-02-16

## 2022-12-16 DIAGNOSIS — E1165 Type 2 diabetes mellitus with hyperglycemia: Secondary | ICD-10-CM

## 2022-12-17 MED ORDER — METFORMIN HCL ER 500 MG PO TB24
500.0000 mg | ORAL_TABLET | Freq: Every day | ORAL | 1 refills | Status: DC
Start: 2022-12-17 — End: 2023-05-24

## 2022-12-21 ENCOUNTER — Ambulatory Visit: Payer: BC Managed Care – PPO | Admitting: Primary Care

## 2023-01-27 ENCOUNTER — Other Ambulatory Visit: Payer: Self-pay

## 2023-01-27 DIAGNOSIS — Z1231 Encounter for screening mammogram for malignant neoplasm of breast: Secondary | ICD-10-CM

## 2023-02-07 LAB — HM DIABETES EYE EXAM

## 2023-02-09 ENCOUNTER — Encounter: Payer: Self-pay | Admitting: Primary Care

## 2023-02-15 DIAGNOSIS — E1165 Type 2 diabetes mellitus with hyperglycemia: Secondary | ICD-10-CM

## 2023-02-16 MED ORDER — TIRZEPATIDE 7.5 MG/0.5ML ~~LOC~~ SOAJ
7.5000 mg | SUBCUTANEOUS | 0 refills | Status: DC
Start: 2023-02-16 — End: 2023-05-16

## 2023-02-23 ENCOUNTER — Other Ambulatory Visit: Payer: Self-pay | Admitting: Primary Care

## 2023-02-23 DIAGNOSIS — I10 Essential (primary) hypertension: Secondary | ICD-10-CM

## 2023-02-23 DIAGNOSIS — E785 Hyperlipidemia, unspecified: Secondary | ICD-10-CM

## 2023-03-10 DIAGNOSIS — I10 Essential (primary) hypertension: Secondary | ICD-10-CM

## 2023-03-10 MED ORDER — LOSARTAN POTASSIUM 100 MG PO TABS: 100.0000 mg | ORAL_TABLET | Freq: Every day | ORAL | 0 refills | Status: DC

## 2023-03-10 NOTE — Telephone Encounter (Signed)
I think patient will need refill you gave in march with #90 2 refills.

## 2023-03-15 ENCOUNTER — Ambulatory Visit
Admission: RE | Admit: 2023-03-15 | Discharge: 2023-03-15 | Disposition: A | Discharge status: Home / Self Care | Payer: BC Managed Care – PPO | Source: Ambulatory Visit | Attending: Surgery | Admitting: Surgery

## 2023-03-15 DIAGNOSIS — Z1231 Encounter for screening mammogram for malignant neoplasm of breast: Secondary | ICD-10-CM | POA: Diagnosis present

## 2023-03-21 ENCOUNTER — Other Ambulatory Visit: Payer: Self-pay | Admitting: Primary Care

## 2023-03-21 DIAGNOSIS — E785 Hyperlipidemia, unspecified: Secondary | ICD-10-CM

## 2023-03-24 ENCOUNTER — Ambulatory Visit: Payer: BC Managed Care – PPO | Admitting: Surgery

## 2023-03-30 IMAGING — US US BREAST*L* LIMITED INC AXILLA
3 series · 12 of 12 positions shown · non-contrast
Comparison: Previous exam(s).

CLINICAL DATA: Left breast focal asymmetry seen on most recent
screening mammography.

EXAM:
DIGITAL DIAGNOSTIC UNILATERAL LEFT MAMMOGRAM WITH TOMOSYNTHESIS AND
CAD; ULTRASOUND LEFT BREAST LIMITED
TECHNIQUE: Left digital diagnostic mammography and breast tomosynthesis was
performed. The images were evaluated with computer-aided detection.;
Targeted ultrasound examination of the left breast was performed.

[Series 1: us breast*left* limited inc axilla · 0.06mm/px · 7 of 7 slices shown (1 of 3)]
[im 1/7]
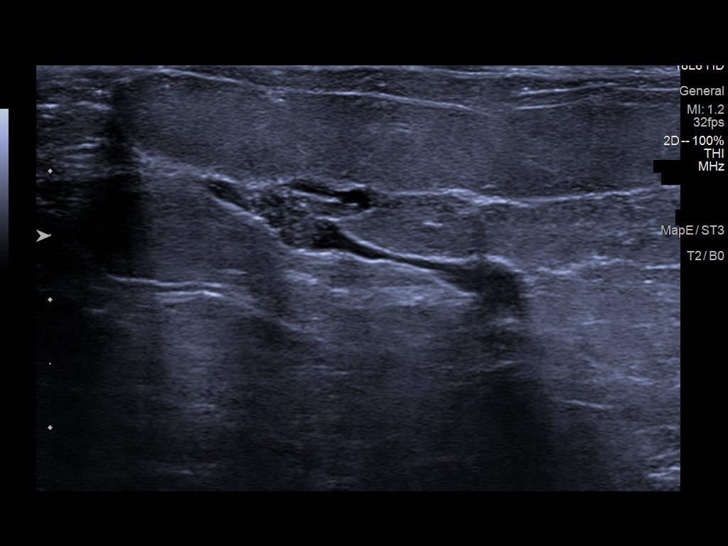
[im 2/7]
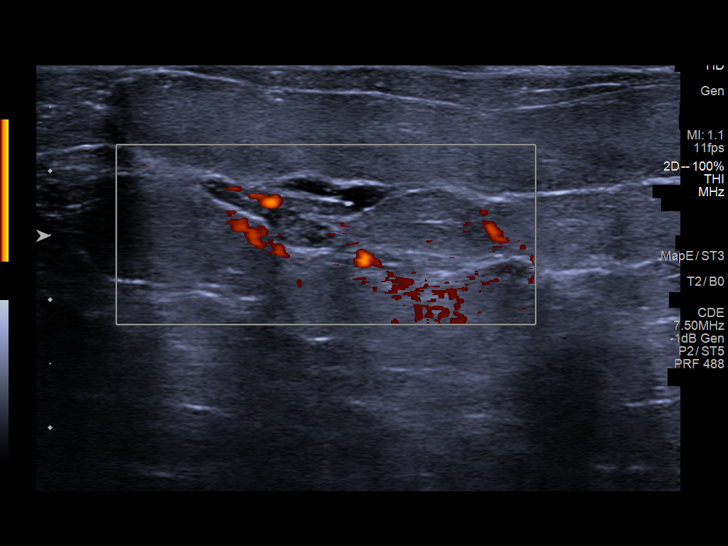
[im 3/7]
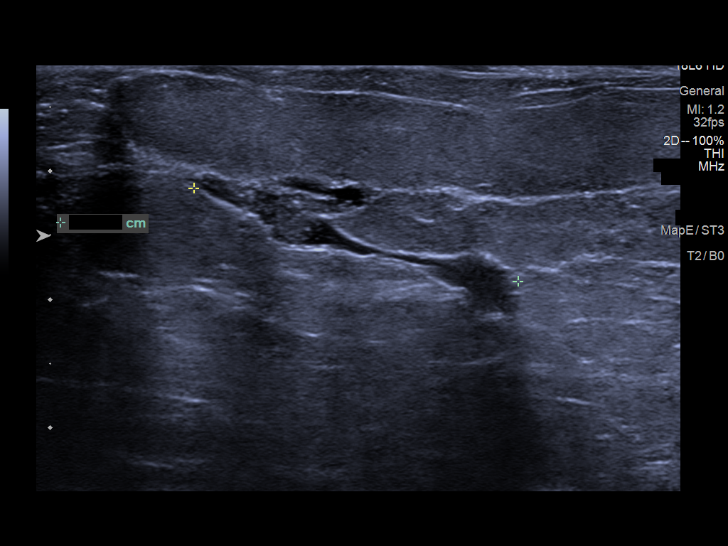
[im 4/7]
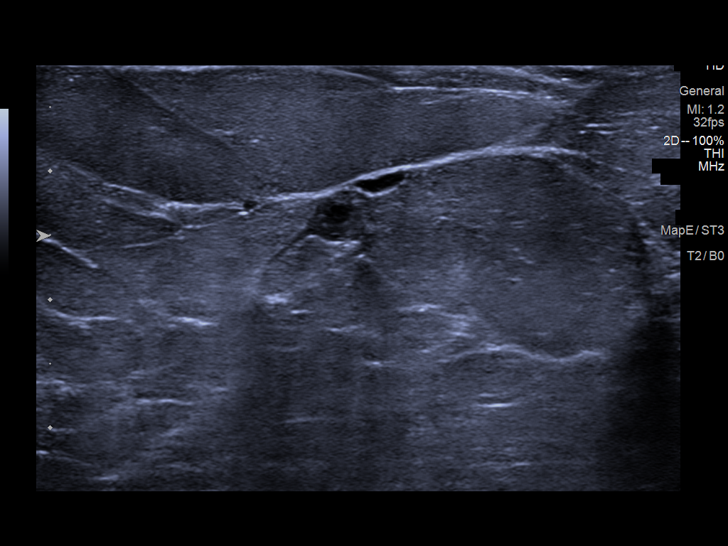
[im 5/7]
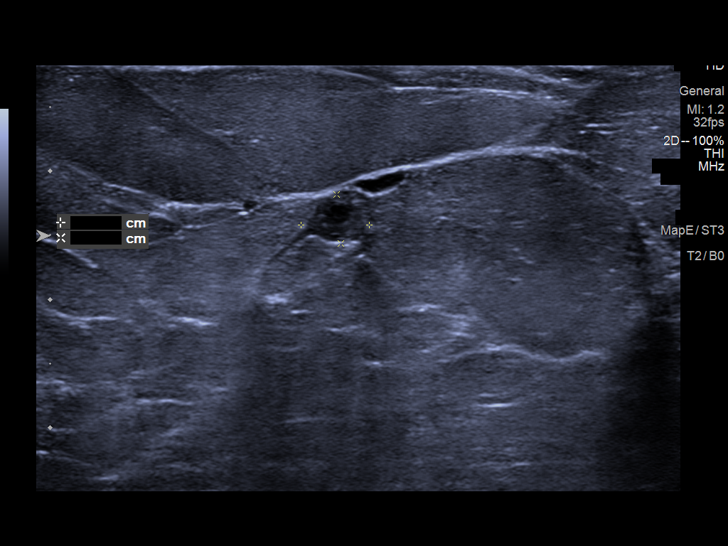
[im 6/7]
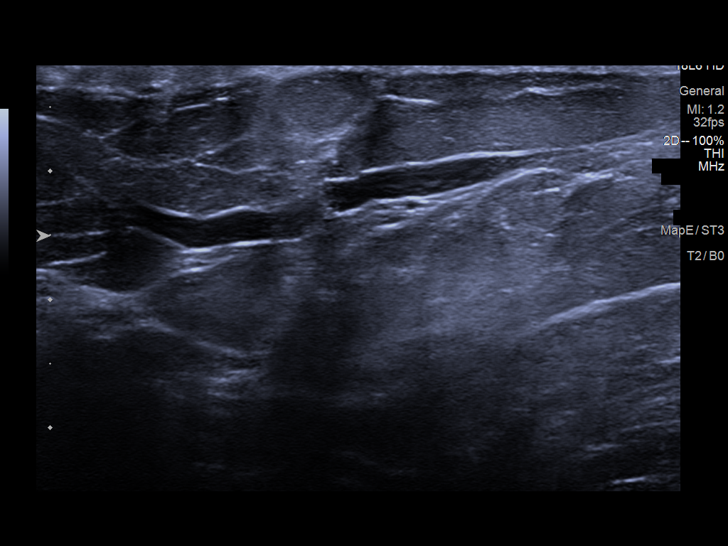
[im 7/7]
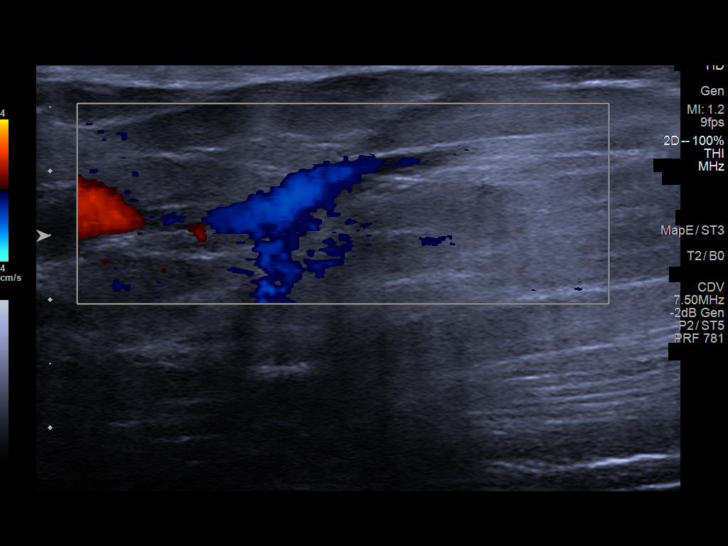

[Series 2: us breast*left* limited inc axilla · 0.08mm/px · 1 of 1 slices shown (2 of 3)]
[im 1/1]
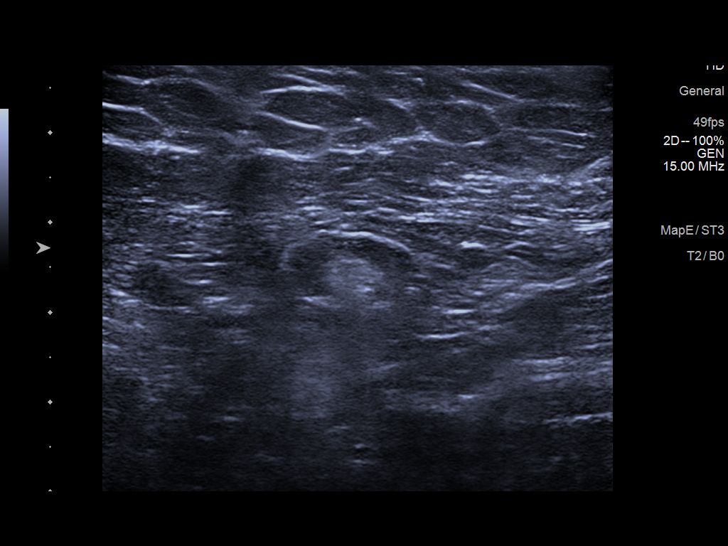

[Series 3: us breast*left* limited inc axilla · 0.06mm/px · 4 of 4 slices shown (3 of 3)]
[im 1/4]
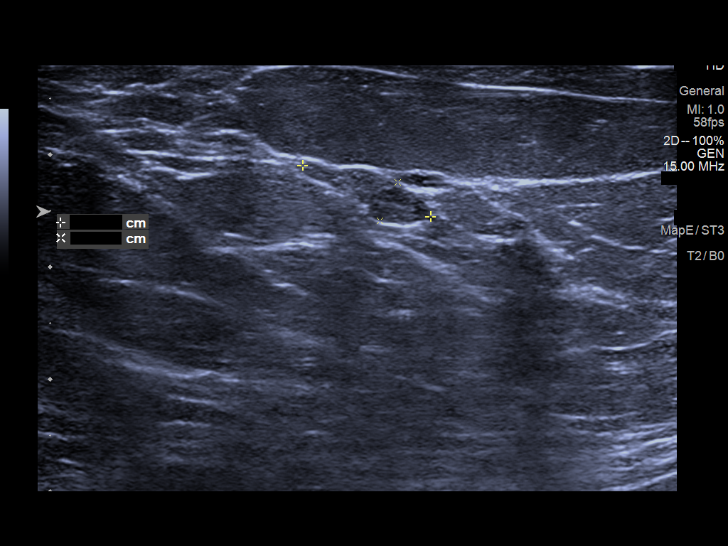
[im 2/4]
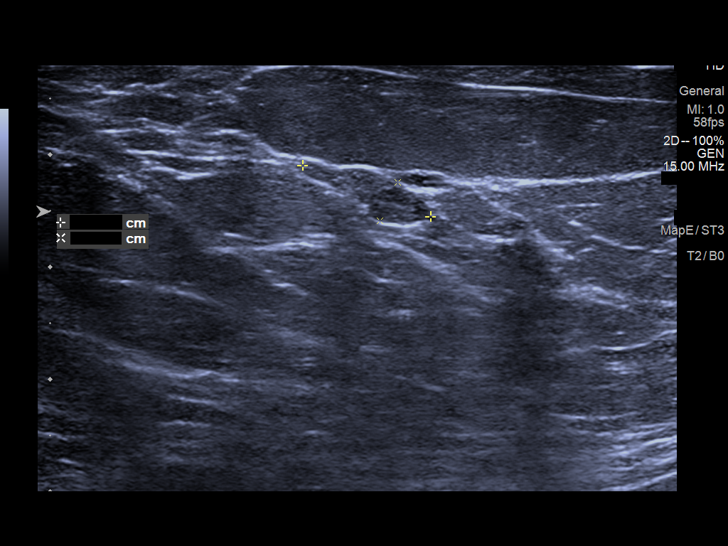
[im 3/4]
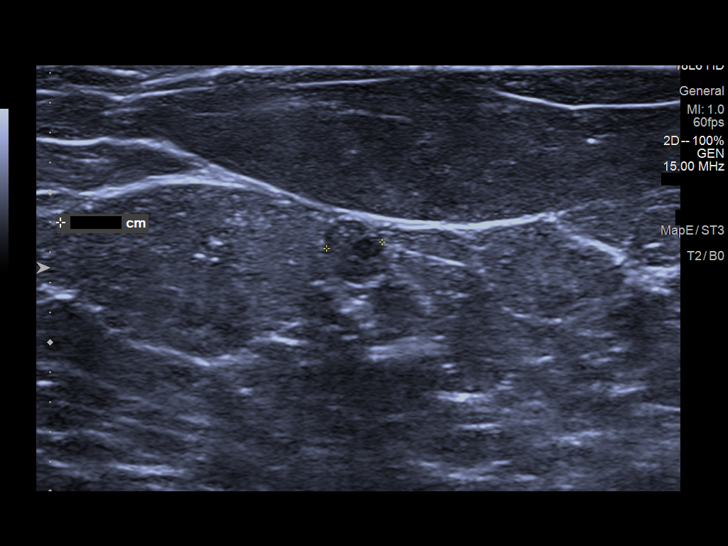
[im 4/4]
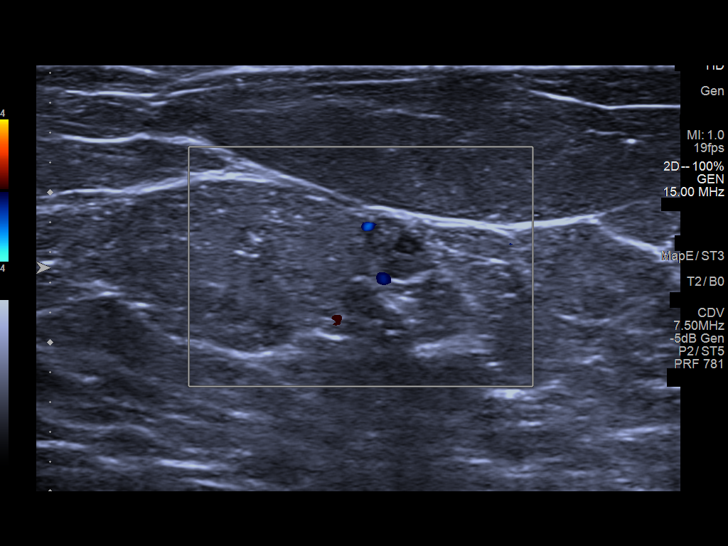

[12 of 12 positions shown; findings below may reference images not displayed]

ACR Breast Density Category b: There are scattered areas of
fibroglandular density.
FINDINGS: Additional mammographic views of the left breast demonstrate a
linear focal asymmetry, favored to represent a dilated duct in the
lower outer left breast, anterior to middle depth.

Targeted left breast ultrasound is performed demonstrating a single
branching dilated duct in the left 3 o'clock breast 4 cm from the
nipple. This finding corresponds to the mammographically seen
abnormality. Within the middle portion of the dilated duct there is
an intraductal mass versus debris which measures 0.5 by 0.4 by
cm. There is no evidence of left axillary lymphadenopathy.
IMPRESSION: Left breast 3 o'clock duct ectasia with probable intraductal mass,
for which ultrasound-guided core needle biopsy is recommended.

RECOMMENDATION:
Ultrasound-guided core needle biopsy of the left breast.

Further evaluation with breast MRI may also be considered, following
the ultrasound-guided biopsy.

I have discussed the findings and recommendations with the patient.
If applicable, a reminder letter will be sent to the patient
regarding the next appointment.

BI-RADS CATEGORY  4: Suspicious.

## 2023-03-30 IMAGING — MG MM DIGITAL DIAGNOSTIC UNILAT*L* W/ TOMO W/ CAD
6 series · 6 of 18 positions shown · non-contrast
Comparison: Previous exam(s).

CLINICAL DATA: Left breast focal asymmetry seen on most recent
screening mammography.

EXAM:
DIGITAL DIAGNOSTIC UNILATERAL LEFT MAMMOGRAM WITH TOMOSYNTHESIS AND
CAD; ULTRASOUND LEFT BREAST LIMITED
TECHNIQUE: Left digital diagnostic mammography and breast tomosynthesis was
performed. The images were evaluated with computer-aided detection.;
Targeted ultrasound examination of the left breast was performed.

[L CC synth-2D (1 of 2)]
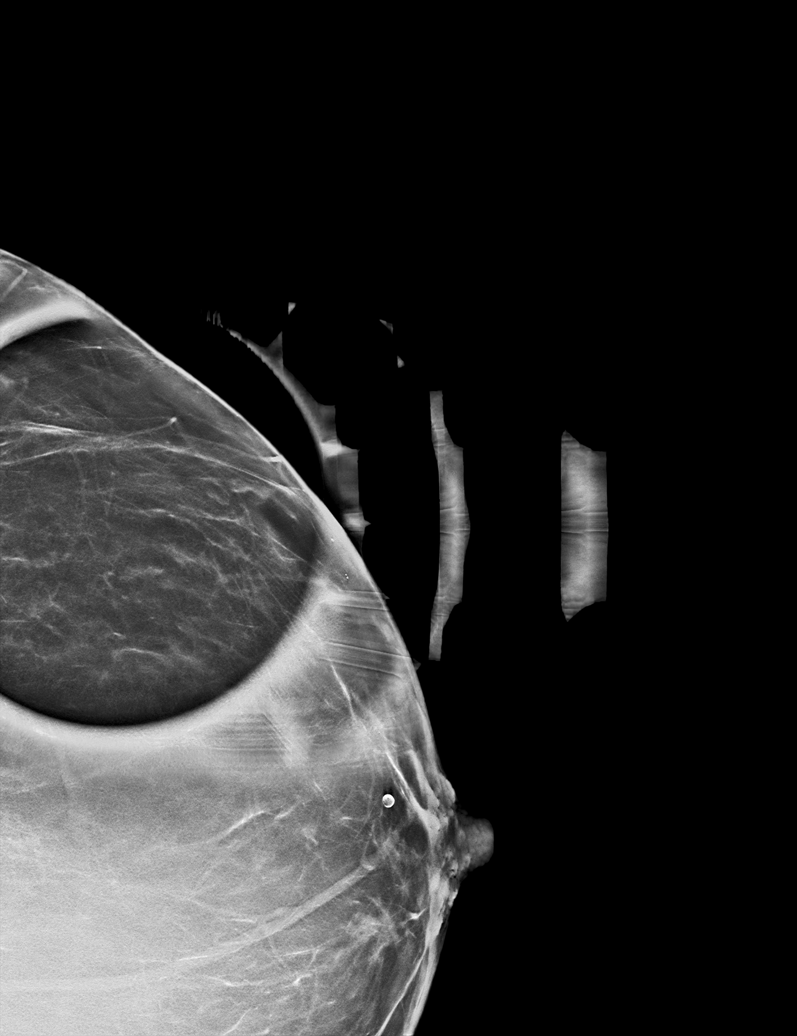

[L MLO synth-2D]
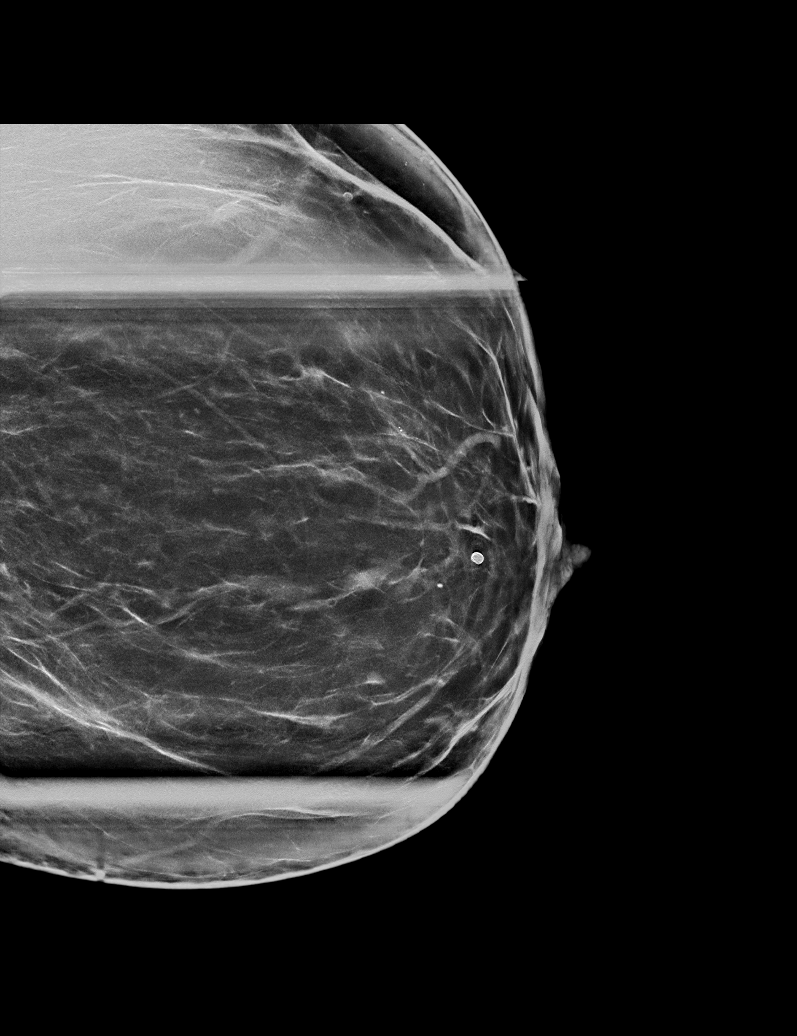

[L CC synth-2D (2 of 2)]
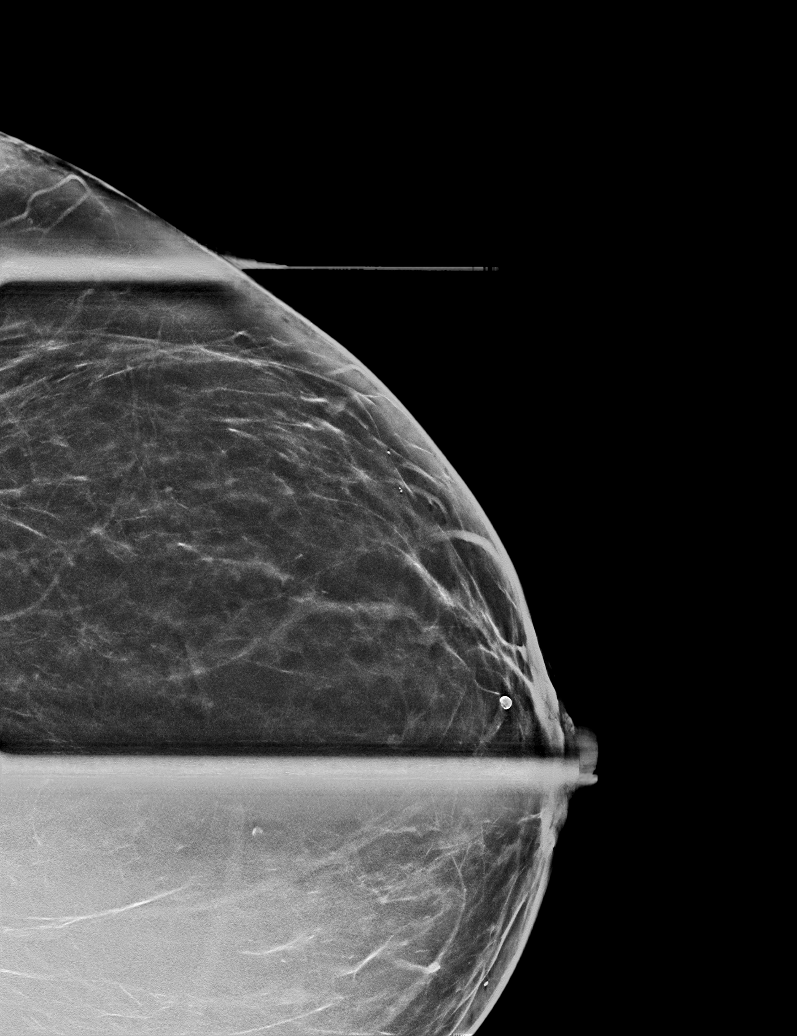

[L CC tomo (1 of 2) · tomo slice 34/67.0]
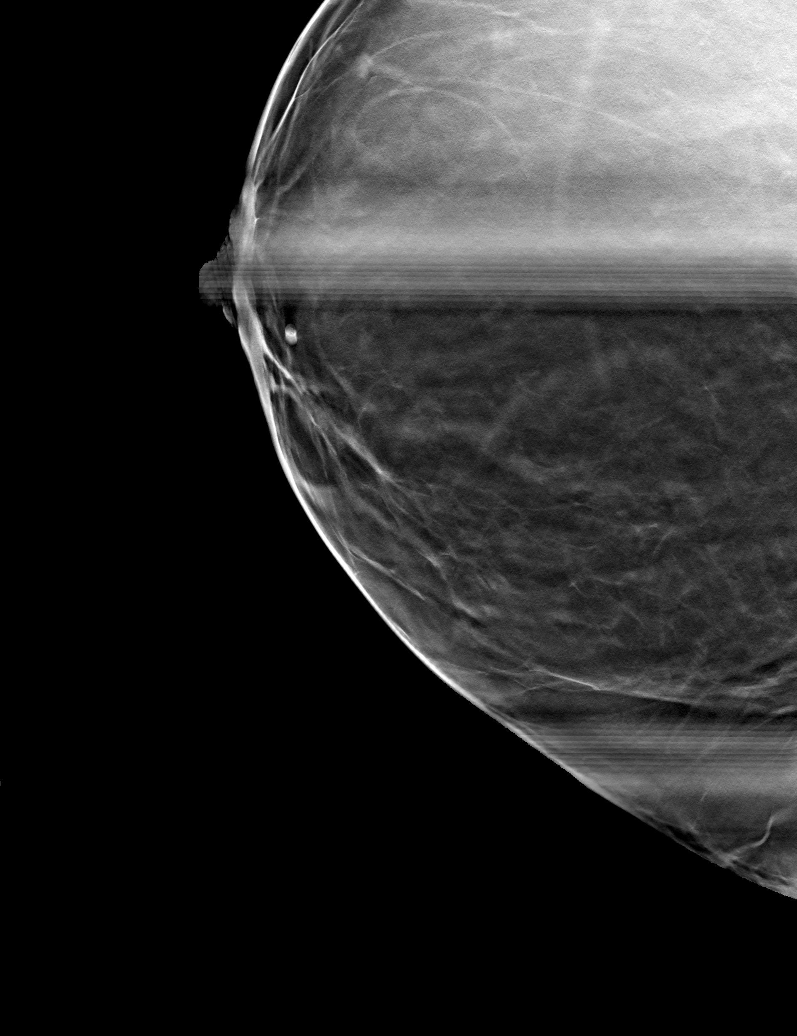

[L MLO tomo · tomo slice 40/79.0]
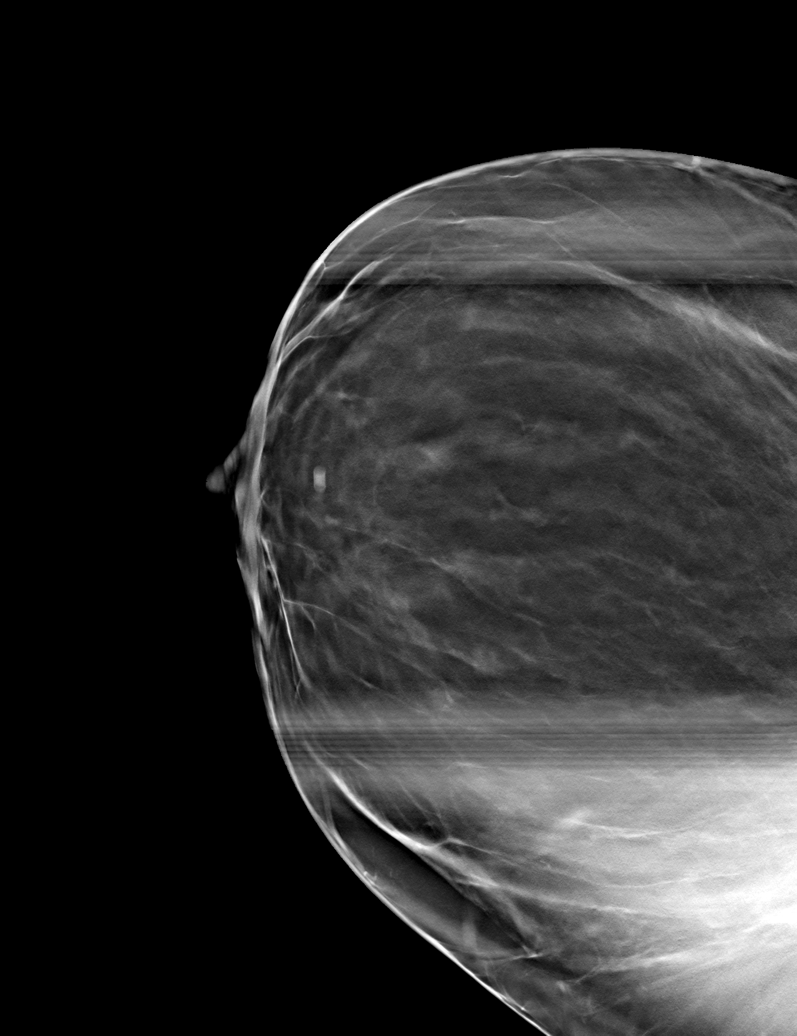

[L CC tomo (2 of 2) · tomo slice 31/62.0]
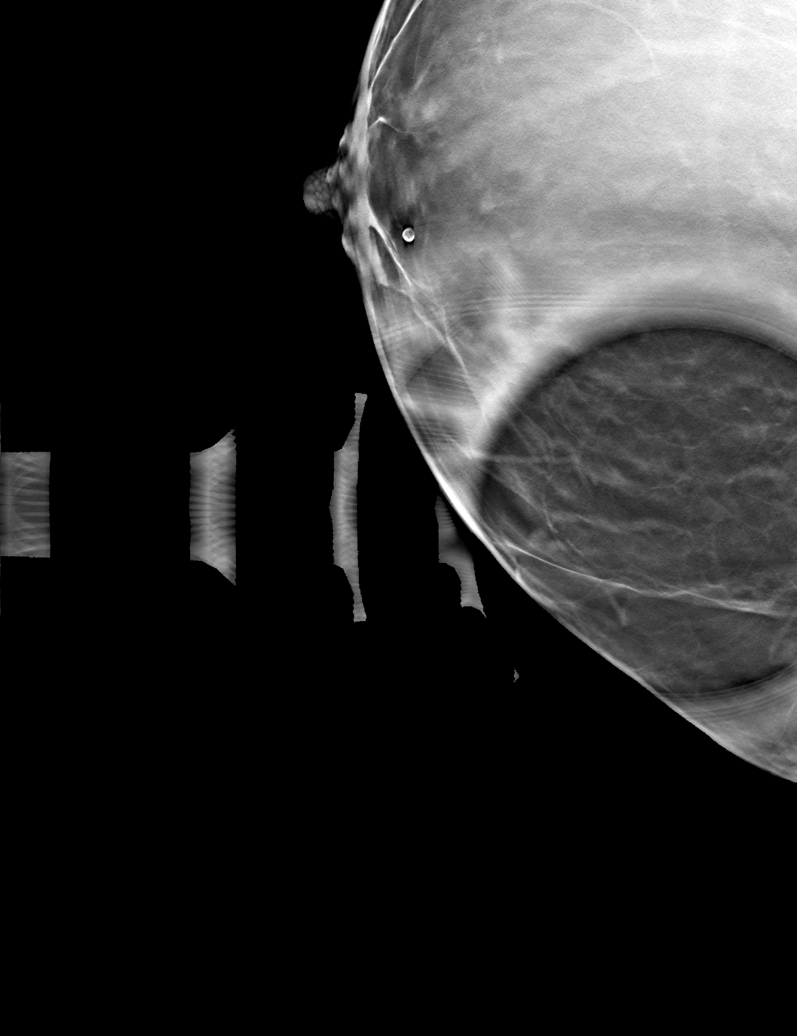

[6 of 18 positions shown; findings below may reference images not displayed]

ACR Breast Density Category b: There are scattered areas of
fibroglandular density.
FINDINGS: Additional mammographic views of the left breast demonstrate a
linear focal asymmetry, favored to represent a dilated duct in the
lower outer left breast, anterior to middle depth.

Targeted left breast ultrasound is performed demonstrating a single
branching dilated duct in the left 3 o'clock breast 4 cm from the
nipple. This finding corresponds to the mammographically seen
abnormality. Within the middle portion of the dilated duct there is
an intraductal mass versus debris which measures 0.5 by 0.4 by
cm. There is no evidence of left axillary lymphadenopathy.
IMPRESSION: Left breast 3 o'clock duct ectasia with probable intraductal mass,
for which ultrasound-guided core needle biopsy is recommended.

RECOMMENDATION:
Ultrasound-guided core needle biopsy of the left breast.

Further evaluation with breast MRI may also be considered, following
the ultrasound-guided biopsy.

I have discussed the findings and recommendations with the patient.
If applicable, a reminder letter will be sent to the patient
regarding the next appointment.

BI-RADS CATEGORY  4: Suspicious.

## 2023-05-14 ENCOUNTER — Other Ambulatory Visit: Payer: Self-pay | Admitting: Primary Care

## 2023-05-14 DIAGNOSIS — E785 Hyperlipidemia, unspecified: Secondary | ICD-10-CM

## 2023-05-16 ENCOUNTER — Other Ambulatory Visit: Payer: Self-pay | Admitting: Primary Care

## 2023-05-16 DIAGNOSIS — E1165 Type 2 diabetes mellitus with hyperglycemia: Secondary | ICD-10-CM

## 2023-05-20 ENCOUNTER — Encounter: Payer: Self-pay | Admitting: Primary Care

## 2023-05-20 ENCOUNTER — Ambulatory Visit (INDEPENDENT_AMBULATORY_CARE_PROVIDER_SITE_OTHER): Payer: 59 | Admitting: Primary Care

## 2023-05-20 VITALS — BP 122/76 | HR 85 | Temp 97.2°F | Ht 60.0 in | Wt 216.0 lb

## 2023-05-20 DIAGNOSIS — E1165 Type 2 diabetes mellitus with hyperglycemia: Secondary | ICD-10-CM | POA: Diagnosis not present

## 2023-05-20 DIAGNOSIS — Z7985 Long-term (current) use of injectable non-insulin antidiabetic drugs: Secondary | ICD-10-CM | POA: Diagnosis not present

## 2023-05-20 DIAGNOSIS — Z23 Encounter for immunization: Secondary | ICD-10-CM

## 2023-05-20 LAB — POCT GLYCOSYLATED HEMOGLOBIN (HGB A1C): Hemoglobin A1C: 5.8 % — AB (ref 4.0–5.6)

## 2023-05-20 MED ORDER — TIRZEPATIDE 10 MG/0.5ML ~~LOC~~ SOAJ: 10.0000 mg | SUBCUTANEOUS | 0 refills | Status: DC

## 2023-05-20 NOTE — Assessment & Plan Note (Signed)
Controlled with A1c of 5.8 today.  Discussed weight loss plateau and her frustrations.  Increase Mounjaro to 10 mg weekly x 4 weeks with plans to titrate upward thereafter if needed. Continue metformin 500 mg daily.  Foot exam today.  Follow-up in 3 months.

## 2023-05-20 NOTE — Patient Instructions (Signed)
We increased your Mounjaro dose to 10 mg weekly for diabetes.  Please schedule your physical for 3 months.  It was a pleasure to see you today!

## 2023-05-20 NOTE — Progress Notes (Signed)
Subjective:    Patient ID: Destiny West, female    DOB: [birth year], 53 y.o.   MRN: [id number]  HPI  Destiny West is a very pleasant 53 y.o. female with a history of hypertension, type 2 diabetes, hyperlipidemia who presents today for follow-up of diabetes.  She is due for her first Shingrix vaccine today.  Current medications include: Metformin ER 500 mg daily, Mounjaro 7.5 mg weekly.  She is tolerating Mounjaro well, but is frustrated because of her lack of weight loss since the transition from Ozempic.  She is checking her blood glucose on occasion and is getting readings of: low 100s  Last A1C: 5.9 in July 2024, 5.8 today  Last Eye Exam: Up-to-date Last Foot Exam: Due Pneumonia Vaccination: 2023 Urine Microalbumin: Up-to-date Statin: Rosuvastatin  Dietary changes since last visit: Increased grilled chicken and fish, increased veggies. Reduced portion sizes.    Exercise: None   Wt Readings from Last 3 Encounters:  05/20/23 216 lb (98 kg)  11/11/22 214 lb 9.6 oz (97.3 kg)  06/22/22 221 lb (100.2 kg)    BP Readings from Last 3 Encounters:  05/20/23 122/76  11/11/22 128/70  06/22/22 124/80     Review of Systems  Eyes:  Negative for visual disturbance.  Respiratory:  Negative for shortness of breath.   Cardiovascular:  Negative for chest pain.  Gastrointestinal:  Negative for abdominal pain, constipation and nausea.  Neurological:  Negative for numbness.         Past Medical History:  Diagnosis Date   Anxiety    Asthma    Chicken pox    Diabetes mellitus without complication (HCC)    boderline   Hyperlipidemia    Hypertension     Social History   Socioeconomic History   Marital status: Married    Spouse name: Not on file   Number of children: Not on file   Years of education: Not on file   Highest education level: Bachelor's degree (e.g., BA, AB, BS)  Occupational History   Not on file  Tobacco Use   Smoking status: Never    Passive  exposure: Past   Smokeless tobacco: Never  Vaping Use   Vaping status: Never Used  Substance and Sexual Activity   Alcohol use: Yes    Alcohol/week: 0.0 standard drinks of alcohol    Comment: rarely   Drug use: Never   Sexual activity: Not on file  Other Topics Concern   Not on file  Social History Narrative   Married.   No children.   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   Enjoys reading, traveling, movies.   Social Drivers of Corporate investment banker Strain: Low Risk  (05/19/2023)   Overall Financial Resource Strain (CARDIA)    Difficulty of Paying Living Expenses: Not very hard  Food Insecurity: No Food Insecurity (05/19/2023)   Hunger Vital Sign    Worried About Running Out of Food in the Last Year: Never true    Ran Out of Food in the Last Year: Never true  Transportation Needs: No Transportation Needs (05/19/2023)   PRAPARE - Administrator, Civil Service (Medical): No    Lack of Transportation (Non-Medical): No  Physical Activity: Insufficiently Active (05/19/2023)   Exercise Vital Sign    Days of Exercise per Week: 3 days    Minutes of Exercise per Session: 40 min  Stress: Stress Concern Present (05/19/2023)   [place]  [employer]ational [employer] - Occupational Stress Questionnaire    Feeling of Stress : To some extent  Social Connections: Moderately Isolated (05/19/2023)   Social Connection and Isolation Panel [NHANES]    Frequency of Communication with Friends and Family: Once a week    Frequency of Social Gatherings with Friends and Family: Never    Attends Religious Services: 1 to 4 times per year    Active Member of [employer] or Organizations: No    Attends Engineer, structural: Not on file    Marital Status: Married  Catering manager Violence: Not on file    Past Surgical History:  Procedure Laterality Date   BREAST LUMPECTOMY WITH RADIO FREQUENCY LOCALIZER Left 05/20/2021   Procedure: BREAST LUMPECTOMY  WITH RADIO FREQUENCY LOCALIZER;  Surgeon: Campbell Lerner, MD;  Location: [facility] ORS;  Service: General;  Laterality: Left;   CATARACT EXTRACTION W/PHACO Left 06/10/2021   Procedure: CATARACT EXTRACTION PHACO AND INTRAOCULAR LENS PLACEMENT (IOC) LEFT DIABETIC 2.52 00:28.0;  Surgeon: Lockie Mola, MD;  Location: [facility];  Service: Ophthalmology;  Laterality: Left;   COLONOSCOPY WITH PROPOFOL N/A 07/31/2021   Procedure: COLONOSCOPY WITH PROPOFOL;  Surgeon: Toney Reil, MD;  Location: [facility] ENDOSCOPY;  Service: Gastroenterology;  Laterality: N/A;   POLYPECTOMY  2012   Uterine    Family History  Problem Relation Age of Onset   Cancer Mother        breast   Breast cancer Mother 69   Heart disease Father    Hypertension Father    Heart disease Maternal Grandfather    Hypertension Maternal Grandfather    Diabetes Maternal Grandfather     No Known Allergies  Current Outpatient Medications on File Prior to Visit  Medication Sig Dispense Refill   amLODipine (NORVASC) 5 MG tablet TAKE 1 TABLET BY MOUTH EVERY DAY FOR BLOOD PRESSURE 90 tablet 2   Cholecalciferol (VITAMIN D) 50 MCG (2000 UT) tablet Take 4,000 Units by mouth daily.     DUREZOL 0.05 % EMUL Place 1 drop into the left eye every other day.     hydrochlorothiazide (HYDRODIURIL) 25 MG tablet Take 1 tablet (25 mg total) by mouth daily. for blood pressure. 90 tablet 3   losartan (COZAAR) 100 MG tablet Take 1 tablet (100 mg total) by mouth daily. for blood pressure. 90 tablet 0   metFORMIN (GLUCOPHAGE-XR) 500 MG 24 hr tablet Take 1 tablet (500 mg total) by mouth daily with breakfast. for diabetes. 90 tablet 1   Omega-3 Fatty Acids (FISH OIL) 1000 MG CAPS Take 1,000 mg by mouth in the morning and at bedtime.     rosuvastatin (CRESTOR) 10 MG tablet TAKE 1 TABLET BY MOUTH EVERY DAY FOR CHOLESTEROL 90 tablet 0   sertraline (ZOLOFT) 100 MG tablet TAKE 1 TABLET (100 MG TOTAL) BY MOUTH DAILY. FOR ANXIETY 90 tablet 2    sertraline (ZOLOFT) 25 MG tablet TAKE 1 TABLET (25 MG TOTAL) BY MOUTH DAILY. FOR ANXIETY AND DEPRESSION. TAKE WITH 100 MG DOSE. 90 tablet 2   valACYclovir (VALTREX) 500 MG tablet Take 500 mg by mouth daily.     albuterol (PROAIR HFA) 108 (90 Base) MCG/ACT inhaler Inhale 2 puffs into the lungs every 6 (six) hours as needed for wheezing or shortness of breath. (Patient not taking: Reported on 05/20/2023) 1 each 0   [DISCONTINUED] simvastatin (ZOCOR) 20 MG tablet TAKE 1 TABLET BY MOUTH EVERY DAY IN THE EVENING 90 tablet 1   No current facility-administered medications on file  prior to visit.    BP 122/76   Pulse 85   Temp (!) 97.2 F (36.2 C) (Temporal)   Ht 5' (1.524 m)   Wt 216 lb (98 kg)   SpO2 97%   BMI 42.18 kg/m  Objective:   Physical Exam Cardiovascular:     Rate and Rhythm: Normal rate and regular rhythm.  Pulmonary:     Effort: Pulmonary effort is normal.     Breath sounds: Normal breath sounds.  Musculoskeletal:     Cervical back: Neck supple.  Skin:    General: Skin is warm and dry.  Neurological:     Mental Status: She is alert and oriented to person, place, and time.  Psychiatric:        Mood and Affect: Mood normal.           Assessment & Plan:  Type 2 diabetes mellitus with hyperglycemia, without long-term current use of insulin (HCC) Assessment & Plan: Controlled with A1c of 5.8 today.  Discussed weight loss plateau and her frustrations.  Increase Mounjaro to 10 mg weekly x 4 weeks with plans to titrate upward thereafter if needed. Continue metformin 500 mg daily.  Foot exam today.  Follow-up in 3 months.  Orders: -     POCT glycosylated hemoglobin (Hb A1C) -     Tirzepatide; Inject 10 mg into the skin once a week. for diabetes.  Dispense: 2 mL; Refill: 0        Doreene Nest, NP

## 2023-05-24 ENCOUNTER — Other Ambulatory Visit: Payer: Self-pay | Admitting: Primary Care

## 2023-05-24 DIAGNOSIS — F411 Generalized anxiety disorder: Secondary | ICD-10-CM

## 2023-05-24 DIAGNOSIS — E1165 Type 2 diabetes mellitus with hyperglycemia: Secondary | ICD-10-CM

## 2023-05-24 MED ORDER — METFORMIN HCL ER 500 MG PO TB24: 500.0000 mg | ORAL_TABLET | Freq: Every day | ORAL | 1 refills | Status: DC

## 2023-05-26 ENCOUNTER — Other Ambulatory Visit: Payer: Self-pay | Admitting: Primary Care

## 2023-05-26 DIAGNOSIS — F411 Generalized anxiety disorder: Secondary | ICD-10-CM

## 2023-05-26 DIAGNOSIS — I10 Essential (primary) hypertension: Secondary | ICD-10-CM

## 2023-06-04 ENCOUNTER — Other Ambulatory Visit: Payer: Self-pay | Admitting: Primary Care

## 2023-06-04 DIAGNOSIS — I10 Essential (primary) hypertension: Secondary | ICD-10-CM

## 2023-06-16 ENCOUNTER — Other Ambulatory Visit: Payer: Self-pay | Admitting: Primary Care

## 2023-06-16 DIAGNOSIS — I10 Essential (primary) hypertension: Secondary | ICD-10-CM

## 2023-06-17 DIAGNOSIS — E1165 Type 2 diabetes mellitus with hyperglycemia: Secondary | ICD-10-CM

## 2023-06-17 MED ORDER — TIRZEPATIDE 10 MG/0.5ML ~~LOC~~ SOAJ: 10.0000 mg | SUBCUTANEOUS | 0 refills | Status: DC

## 2023-06-24 ENCOUNTER — Encounter: Payer: BC Managed Care – PPO | Admitting: Primary Care

## 2023-06-28 ENCOUNTER — Other Ambulatory Visit: Payer: Self-pay | Admitting: Primary Care

## 2023-06-28 DIAGNOSIS — I10 Essential (primary) hypertension: Secondary | ICD-10-CM

## 2023-07-21 DIAGNOSIS — E1165 Type 2 diabetes mellitus with hyperglycemia: Secondary | ICD-10-CM

## 2023-07-22 MED ORDER — TIRZEPATIDE 12.5 MG/0.5ML ~~LOC~~ SOAJ: 12.5000 mg | SUBCUTANEOUS | 0 refills | Status: DC

## 2023-07-26 ENCOUNTER — Encounter: Payer: Self-pay | Admitting: Primary Care

## 2023-07-26 ENCOUNTER — Ambulatory Visit

## 2023-07-26 ENCOUNTER — Other Ambulatory Visit (HOSPITAL_COMMUNITY)
Admission: RE | Admit: 2023-07-26 | Discharge: 2023-07-26 | Disposition: A | Discharge status: Home / Self Care | Source: Ambulatory Visit | Attending: Primary Care | Admitting: Primary Care

## 2023-07-26 ENCOUNTER — Ambulatory Visit: Admitting: Primary Care

## 2023-07-26 VITALS — BP 108/60 | HR 90 | Temp 97.4°F | Ht 60.0 in | Wt 209.0 lb

## 2023-07-26 DIAGNOSIS — E1165 Type 2 diabetes mellitus with hyperglycemia: Secondary | ICD-10-CM | POA: Diagnosis not present

## 2023-07-26 DIAGNOSIS — J4520 Mild intermittent asthma, uncomplicated: Secondary | ICD-10-CM

## 2023-07-26 DIAGNOSIS — E66813 Obesity, class 3: Secondary | ICD-10-CM

## 2023-07-26 DIAGNOSIS — Z0000 Encounter for general adult medical examination without abnormal findings: Secondary | ICD-10-CM | POA: Diagnosis not present

## 2023-07-26 DIAGNOSIS — Z23 Encounter for immunization: Secondary | ICD-10-CM | POA: Diagnosis not present

## 2023-07-26 DIAGNOSIS — E785 Hyperlipidemia, unspecified: Secondary | ICD-10-CM

## 2023-07-26 DIAGNOSIS — Z8619 Personal history of other infectious and parasitic diseases: Secondary | ICD-10-CM

## 2023-07-26 DIAGNOSIS — I10 Essential (primary) hypertension: Secondary | ICD-10-CM

## 2023-07-26 DIAGNOSIS — Z7985 Long-term (current) use of injectable non-insulin antidiabetic drugs: Secondary | ICD-10-CM

## 2023-07-26 DIAGNOSIS — Z111 Encounter for screening for respiratory tuberculosis: Secondary | ICD-10-CM | POA: Diagnosis not present

## 2023-07-26 DIAGNOSIS — Z124 Encounter for screening for malignant neoplasm of cervix: Secondary | ICD-10-CM

## 2023-07-26 DIAGNOSIS — Z7984 Long term (current) use of oral hypoglycemic drugs: Secondary | ICD-10-CM | POA: Diagnosis not present

## 2023-07-26 DIAGNOSIS — Z6841 Body Mass Index (BMI) 40.0 and over, adult: Secondary | ICD-10-CM

## 2023-07-26 DIAGNOSIS — F411 Generalized anxiety disorder: Secondary | ICD-10-CM

## 2023-07-26 LAB — LIPID PANEL
Cholesterol: 181 mg/dL (ref 0–200)
HDL: 44.4 mg/dL (ref >39.00)
LDL Cholesterol: 90 mg/dL (ref 0–99)
NonHDL: 136.85
Total CHOL/HDL Ratio: 4
Triglycerides: 236 mg/dL — ABNORMAL HIGH (ref 0.0–149.0)
VLDL: 47.2 mg/dL — ABNORMAL HIGH (ref 0.0–40.0)

## 2023-07-26 LAB — COMPREHENSIVE METABOLIC PANEL
ALT: 22 U/L (ref 0–35)
AST: 21 U/L (ref 0–37)
Albumin: 4.8 g/dL (ref 3.5–5.2)
Alkaline Phosphatase: 50 U/L (ref 39–117)
BUN: 16 mg/dL (ref 6–23)
CO2: 34 meq/L — ABNORMAL HIGH (ref 19–32)
Calcium: 10.4 mg/dL (ref 8.4–10.5)
Chloride: 99 meq/L (ref 96–112)
Creatinine, Ser: 0.71 mg/dL (ref 0.40–1.20)
GFR: 98.9 mL/min (ref >60.00)
Glucose, Bld: 92 mg/dL (ref 70–99)
Potassium: 3.9 meq/L (ref 3.5–5.1)
Sodium: 142 meq/L (ref 135–145)
Total Bilirubin: 0.3 mg/dL (ref 0.2–1.2)
Total Protein: 7.5 g/dL (ref 6.0–8.3)

## 2023-07-26 LAB — MICROALBUMIN / CREATININE URINE RATIO
Creatinine,U: 109.3 mg/dL
Microalb Creat Ratio: 7 mg/g (ref 0.0–30.0)
Microalb, Ur: 0.8 mg/dL (ref 0.0–1.9)

## 2023-07-26 LAB — HEMOGLOBIN A1C: Hgb A1c MFr Bld: 5.9 % (ref 4.6–6.5)

## 2023-07-26 NOTE — Patient Instructions (Signed)
 Stop by the lab prior to leaving today. I will notify you of your results once received.   Please schedule a follow up visit for 6 months for a diabetes check.  It was a pleasure to see you today!

## 2023-07-26 NOTE — Assessment & Plan Note (Signed)
 Controlled.  Continue albuterol inhaler as needed for which she is sparingly.

## 2023-07-26 NOTE — Assessment & Plan Note (Signed)
 Controlled, borderline low. Asymptomatic.   Continue amlodipine 5 mg daily, hydrochlorothiazide 25 mg daily, losartan 100 mg daily. CMP pending.

## 2023-07-26 NOTE — Progress Notes (Signed)
 Subjective:    Patient ID: Destiny West, female    DOB: 04-21-1971, 53 y.o.   MRN: [id number]  HPI  Destiny West is a very pleasant 53 y.o. female who presents today for complete physical and follow up of chronic conditions.  Immunizations: -Tetanus: Completed in 2017 -Shingles: Completed Shingrix x 1 dose -Pneumonia: Completed in 2023  Diet: Fair diet.  Exercise: No regular exercise.  Eye exam: Completes annually  Dental exam: Completes semi-annually    Pap Smear: Completed in 2020, due today. Mammogram: Completed in November 2024  Colonoscopy: Completed in 2023, due 2033.   BP Readings from Last 3 Encounters:  07/26/23 108/60  05/20/23 122/76  11/11/22 128/70   Wt Readings from Last 3 Encounters:  07/26/23 209 lb (94.8 kg)  05/20/23 216 lb (98 kg)  11/11/22 214 lb 9.6 oz (97.3 kg)    Body mass index is 40.82 kg/m.    Review of Systems  Constitutional:  Negative for unexpected weight change.  HENT:  Negative for rhinorrhea.   Respiratory:  Negative for cough and shortness of breath.   Cardiovascular:  Negative for chest pain.  Gastrointestinal:  Negative for constipation and diarrhea.  Genitourinary:  Negative for difficulty urinating.  Musculoskeletal:  Negative for arthralgias and myalgias.  Skin:  Negative for rash.  Allergic/Immunologic: Negative for environmental allergies.  Neurological:  Negative for dizziness and headaches.  Psychiatric/Behavioral:  The patient is not nervous/anxious.          Past Medical History:  Diagnosis Date   Anxiety    Asthma    Chicken pox    Diabetes mellitus without complication (HCC)    boderline   Hyperlipidemia    Hypertension     Social History   Socioeconomic History   Marital status: Married    Spouse name: Not on file   Number of children: Not on file   Years of education: Not on file   Highest education level: Bachelor's degree (e.g., BA, AB, BS)  Occupational History   Not on file  Tobacco  Use   Smoking status: Never    Passive exposure: Past   Smokeless tobacco: Never  Vaping Use   Vaping status: Never Used  Substance and Sexual Activity   Alcohol use: Yes    Alcohol/week: 0.0 standard drinks of alcohol    Comment: rarely   Drug use: Never   Sexual activity: Not on file  Other Topics Concern   Not on file  Social History Narrative   Married.   No children.   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   Enjoys reading, traveling, movies.   Social Drivers of Corporate investment banker Strain: Low Risk  (05/19/2023)   Overall Financial Resource Strain (CARDIA)    Difficulty of Paying Living Expenses: Not very hard  Food Insecurity: No Food Insecurity (05/19/2023)   Hunger Vital Sign    Worried About Running Out of Food in the Last Year: Never true    Ran Out of Food in the Last Year: Never true  Transportation Needs: No Transportation Needs (05/19/2023)   PRAPARE - Administrator, Civil Service (Medical): No    Lack of Transportation (Non-Medical): No  Physical Activity: Insufficiently Active (05/19/2023)   Exercise Vital Sign    Days of Exercise per Week: 3 days    Minutes of Exercise per Session: 40 min  Stress: Stress Concern Present (05/19/2023)   [employer]  of Occupational Health - Occupational Stress Questionnaire    Feeling of Stress : To some extent  Social Connections: Moderately Isolated (05/19/2023)   Social Connection and Isolation Panel [NHANES]    Frequency of Communication with Friends and Family: Once a week    Frequency of Social Gatherings with Friends and Family: Never    Attends Religious Services: 1 to 4 times per year    Active Member of [employer] or Organizations: No    Attends Engineer, structural: Not on file    Marital Status: Married  Catering manager Violence: Not on file    Past Surgical History:  Procedure Laterality Date   BREAST LUMPECTOMY WITH RADIO FREQUENCY LOCALIZER Left  05/20/2021   Procedure: BREAST LUMPECTOMY WITH RADIO FREQUENCY LOCALIZER;  Surgeon: Campbell Lerner, MD;  Location: [facility] ORS;  Service: General;  Laterality: Left;   CATARACT EXTRACTION W/PHACO Left 06/10/2021   Procedure: CATARACT EXTRACTION PHACO AND INTRAOCULAR LENS PLACEMENT (IOC) LEFT DIABETIC 2.52 00:28.0;  Surgeon: Lockie Mola, MD;  Location: [facility];  Service: Ophthalmology;  Laterality: Left;   COLONOSCOPY WITH PROPOFOL N/A 07/31/2021   Procedure: COLONOSCOPY WITH PROPOFOL;  Surgeon: Toney Reil, MD;  Location: [facility];  Service: Gastroenterology;  Laterality: N/A;   POLYPECTOMY  2012   Uterine    Family History  Problem Relation Age of Onset   Cancer Mother        breast   Breast cancer Mother 34   Heart disease Father    Hypertension Father    Heart disease Maternal Grandfather    Hypertension Maternal Grandfather    Diabetes Maternal Grandfather     No Known Allergies  Current Outpatient Medications on File Prior to Visit  Medication Sig Dispense Refill   albuterol (PROAIR HFA) 108 (90 Base) MCG/ACT inhaler Inhale 2 puffs into the lungs every 6 (six) hours as needed for wheezing or shortness of breath. 1 each 0   amLODipine (NORVASC) 5 MG tablet TAKE 1 TABLET BY MOUTH EVERY DAY FOR BLOOD PRESSURE 90 tablet 0   Cholecalciferol (VITAMIN D) 50 MCG (2000 UT) tablet Take 4,000 Units by mouth daily.     DUREZOL 0.05 % EMUL Place 1 drop into the left eye every other day.     hydrochlorothiazide (HYDRODIURIL) 25 MG tablet TAKE 1 TABLET BY MOUTH EVERY DAY FOR BLOOD PRESSURE 90 tablet 0   losartan (COZAAR) 100 MG tablet TAKE 1 TABLET BY MOUTH EVERY DAY FOR BLOOD PRESSURE 90 tablet 0   metFORMIN (GLUCOPHAGE-XR) 500 MG 24 hr tablet Take 1 tablet (500 mg total) by mouth daily with breakfast. for diabetes. 90 tablet 1   Omega-3 Fatty Acids (FISH OIL) 1000 MG CAPS Take 1,000 mg by mouth in the morning and at bedtime.     rosuvastatin (CRESTOR) 10 MG  tablet TAKE 1 TABLET BY MOUTH EVERY DAY FOR CHOLESTEROL 90 tablet 0   sertraline (ZOLOFT) 100 MG tablet TAKE 1 TABLET (100 MG TOTAL) BY MOUTH DAILY. FOR ANXIETY 90 tablet 0   sertraline (ZOLOFT) 25 MG tablet TAKE 1 TABLET (25 MG TOTAL) BY MOUTH DAILY. FOR ANXIETY AND DEPRESSION. TAKE WITH 100 MG DOSE. 90 tablet 2   tirzepatide (MOUNJARO) 12.5 MG/0.5ML Pen Inject 12.5 mg into the skin once a week. for diabetes. 6 mL 0   valACYclovir (VALTREX) 500 MG tablet Take 500 mg by mouth daily.     [DISCONTINUED] simvastatin (ZOCOR) 20 MG tablet TAKE 1 TABLET BY MOUTH EVERY DAY IN THE EVENING 90  tablet 1   No current facility-administered medications on file prior to visit.    BP 108/60   Pulse 90   Temp (!) 97.4 F (36.3 C) (Temporal)   Ht 5' (1.524 m)   Wt 209 lb (94.8 kg)   LMP 06/30/2023   SpO2 96%   BMI 40.82 kg/m  Objective:   Physical Exam Exam conducted with a chaperone present.  HENT:     Right Ear: Tympanic membrane and ear canal normal.     Left Ear: Tympanic membrane and ear canal normal.  Eyes:     Pupils: Pupils are equal, round, and reactive to light.  Cardiovascular:     Rate and Rhythm: Normal rate and regular rhythm.  Pulmonary:     Effort: Pulmonary effort is normal.     Breath sounds: Normal breath sounds.  Abdominal:     General: Bowel sounds are normal.     Palpations: Abdomen is soft.     Tenderness: There is no abdominal tenderness.  Genitourinary:    Labia:        Right: No rash, tenderness or lesion.        Left: No rash, tenderness or lesion.      Vagina: Normal.     Cervix: Normal.     Uterus: Normal.      Adnexa: Right adnexa normal and left adnexa normal.  Musculoskeletal:        General: Normal range of motion.     Cervical back: Neck supple.  Skin:    General: Skin is warm and dry.  Neurological:     Mental Status: She is alert and oriented to person, place, and time.     Cranial Nerves: No cranial nerve deficit.     Deep Tendon Reflexes:      Reflex Scores:      Patellar reflexes are 2+ on the right side and 2+ on the left side. Psychiatric:        Mood and Affect: Mood normal.           Assessment & Plan:  Preventative health care Assessment & Plan: Second Shingrix vaccine provided today. Pap smear due, completed today Mammogram UTD Colonoscopy UTD, due 2033  Discussed the importance of a healthy diet and regular exercise in order for weight loss, and to reduce the risk of further co-morbidity.  Exam stable. Labs pending.  Follow up in 1 year for repeat physical.    History of herpes zoster of eye Assessment & Plan: Following with ophthalmology.  Continue Valtrex 500 mg daily.   Essential hypertension Assessment & Plan: Controlled, borderline low. Asymptomatic.   Continue amlodipine 5 mg daily, hydrochlorothiazide 25 mg daily, losartan 100 mg daily. CMP pending.  Orders: -     Comprehensive metabolic panel  Mild intermittent asthma in adult without complication Assessment & Plan: Controlled.  Continue albuterol inhaler as needed for which she is sparingly.   Type 2 diabetes mellitus with hyperglycemia, without long-term current use of insulin (HCC) Assessment & Plan: Repeat A1c pending.  Continue metformin ER 500 mg daily, new dose of Mounjaro 12.5 mg weekly. Urine microalbumin due and pending. Follow-up in 6 months.  Orders: -     Microalbumin / creatinine urine ratio -     Hemoglobin A1c  Class 3 severe obesity due to excess calories with serious comorbidity and body mass index (BMI) of 40.0 to 44.9 in adult [facility]) Assessment & Plan: Commended her on weight loss.  Continue increased dose of  Mounjaro 12.5 mg weekly.   GAD (generalized anxiety disorder) Assessment & Plan: Controlled.   Continue sertraline 125 mg daily.   Hyperlipidemia, unspecified hyperlipidemia type Assessment & Plan: Repeat lipid panel pending. Continue Crestor 10 mg daily.  Orders: -     Lipid  panel  Screening for cervical cancer -     Cytology - PAP        Doreene Nest, NP

## 2023-07-26 NOTE — Assessment & Plan Note (Signed)
 Commended her on weight loss.  Continue increased dose of Mounjaro 12.5 mg weekly.

## 2023-07-26 NOTE — Assessment & Plan Note (Signed)
 Second Shingrix vaccine provided today. Pap smear due, completed today Mammogram UTD Colonoscopy UTD, due 2033  Discussed the importance of a healthy diet and regular exercise in order for weight loss, and to reduce the risk of further co-morbidity.  Exam stable. Labs pending.  Follow up in 1 year for repeat physical.

## 2023-07-26 NOTE — Assessment & Plan Note (Signed)
 Repeat A1c pending.  Continue metformin ER 500 mg daily, new dose of Mounjaro 12.5 mg weekly. Urine microalbumin due and pending. Follow-up in 6 months.

## 2023-07-26 NOTE — Assessment & Plan Note (Signed)
Repeat lipid panel pending. Continue Crestor 10 mg daily.

## 2023-07-26 NOTE — Assessment & Plan Note (Signed)
Following with ophthalmology. Continue Valtrex 500 mg daily.

## 2023-07-26 NOTE — Assessment & Plan Note (Signed)
Controlled.  Continue sertraline 125 mg daily. 

## 2023-07-26 NOTE — Progress Notes (Signed)
 PPD Placement note Destiny West, 53 y.o. female is here today for placement of PPD test Reason for PPD test: Employer Pt taken PPD test before: no Verified in allergy area and with patient that they are not allergic to the products PPD is made of (Phenol or Tween). : No Is patient taking any oral or IV steroid medication now or have they taken it in the last month? no Has the patient ever received the BCG vaccine?: no Has the patient been in recent contact with anyone known or suspected of having active TB disease?: no      Date of exposure (if applicable): N/A      Name of person they were exposed to (if applicable): N/A Patient's Country of origin?: [place] States O: Alert and oriented in NAD. P:  PPD placed on 07/26/2023.  Patient advised to return for reading within 48-72 hours.

## 2023-07-27 LAB — CYTOLOGY - PAP
Adequacy: ABSENT
Comment: NEGATIVE
Diagnosis: NEGATIVE
High risk HPV: NEGATIVE

## 2023-07-28 ENCOUNTER — Ambulatory Visit

## 2023-07-28 LAB — TB SKIN TEST
Induration: 0 mm
TB Skin Test: NEGATIVE

## 2023-07-28 NOTE — Progress Notes (Signed)
 PPD Reading Note  PPD read and results entered in EpicCare.  Result: 0 mm induration.  Interpretation: negative  If test not read within 48-72 hours of initial placement, patient advised to repeat in other arm 1-3 weeks after this test.  Allergic reaction: no

## 2023-08-10 ENCOUNTER — Other Ambulatory Visit: Payer: Self-pay | Admitting: Primary Care

## 2023-08-10 DIAGNOSIS — E785 Hyperlipidemia, unspecified: Secondary | ICD-10-CM

## 2023-08-18 ENCOUNTER — Encounter: Payer: 59 | Admitting: Primary Care

## 2023-08-25 ENCOUNTER — Other Ambulatory Visit: Payer: Self-pay | Admitting: Primary Care

## 2023-08-25 DIAGNOSIS — F411 Generalized anxiety disorder: Secondary | ICD-10-CM

## 2023-08-27 ENCOUNTER — Other Ambulatory Visit: Payer: Self-pay | Admitting: Primary Care

## 2023-08-27 DIAGNOSIS — F411 Generalized anxiety disorder: Secondary | ICD-10-CM

## 2023-09-03 ENCOUNTER — Other Ambulatory Visit: Payer: Self-pay | Admitting: Primary Care

## 2023-09-03 DIAGNOSIS — I10 Essential (primary) hypertension: Secondary | ICD-10-CM

## 2023-09-11 ENCOUNTER — Other Ambulatory Visit: Payer: Self-pay | Admitting: Primary Care

## 2023-09-11 DIAGNOSIS — I10 Essential (primary) hypertension: Secondary | ICD-10-CM

## 2023-09-22 ENCOUNTER — Other Ambulatory Visit: Payer: Self-pay | Admitting: Primary Care

## 2023-09-22 DIAGNOSIS — I10 Essential (primary) hypertension: Secondary | ICD-10-CM

## 2023-10-11 ENCOUNTER — Other Ambulatory Visit: Payer: Self-pay | Admitting: Primary Care

## 2023-10-11 DIAGNOSIS — E1165 Type 2 diabetes mellitus with hyperglycemia: Secondary | ICD-10-CM

## 2023-11-20 ENCOUNTER — Other Ambulatory Visit: Payer: Self-pay | Admitting: Primary Care

## 2023-11-20 DIAGNOSIS — E1165 Type 2 diabetes mellitus with hyperglycemia: Secondary | ICD-10-CM

## 2023-11-20 NOTE — Telephone Encounter (Signed)
 Patient is due for diabetes follow up in late September, this will be required prior to any further refills.  Please schedule, thank you!

## 2024-01-01 ENCOUNTER — Other Ambulatory Visit: Payer: Self-pay | Admitting: Primary Care

## 2024-01-01 DIAGNOSIS — E1165 Type 2 diabetes mellitus with hyperglycemia: Secondary | ICD-10-CM

## 2024-01-23 ENCOUNTER — Ambulatory Visit: Admitting: Primary Care

## 2024-01-23 ENCOUNTER — Encounter: Payer: Self-pay | Admitting: Primary Care

## 2024-01-23 ENCOUNTER — Ambulatory Visit: Payer: Self-pay | Admitting: Primary Care

## 2024-01-23 VITALS — BP 110/70 | HR 85 | Temp 97.3°F | Ht 60.0 in | Wt 197.0 lb

## 2024-01-23 DIAGNOSIS — E1165 Type 2 diabetes mellitus with hyperglycemia: Secondary | ICD-10-CM | POA: Diagnosis not present

## 2024-01-23 LAB — POCT GLYCOSYLATED HEMOGLOBIN (HGB A1C): Hemoglobin A1C: 5.4 % (ref 4.0–5.6)

## 2024-01-23 NOTE — Progress Notes (Signed)
   Established Patient Office Visit  Subjective   Patient ID: Destiny West, female    DOB: 02-22-1971  Age: 53 y.o. MRN: [id number]  Chief Complaint  Patient presents with   Medical Management of Chronic Issues    HPI Destiny West is a  year old female with a history diabetes and hyperlipidemia being seen for a diabetes follow up.  Current medications include: Metformin  500 mg daily, Mounjaro  12.5 mg weekly.  Not checking blood glucose at home.   Last A1C: 5.9 in March/ Today 5.4 Last Eye Exam: 10/2023 Last Foot Exam:01/23/2024 Urine Microalbumin:  0.09 July 2023 Statin: Rosuvastatin  10 mg  Dietary changes since last visit: Breakfast: Coffee with protein shake. Lunch: salad with grilled chicken. Dinner: [place], chicken, ground [place].   Exercise: Walk dogs for 15 minutes.    Review of Systems  Constitutional: Negative.   HENT: Negative.    Eyes: Negative.   Respiratory: Negative.    Cardiovascular: Negative.   Gastrointestinal: Negative.   Musculoskeletal: Negative.   Skin: Negative.   Neurological: Negative.       Objective:     BP 110/70   Pulse 85   Temp (!) 97.3 F (36.3 C) (Temporal)   Ht 5' (1.524 m)   Wt 89.4 kg   LMP 05/04/2023 (Approximate)   SpO2 96%   BMI 38.47 kg/m    Physical Exam HENT:     Head: Normocephalic.     Right Ear: Tympanic membrane normal.     Left Ear: Tympanic membrane normal.  Cardiovascular:     Rate and Rhythm: Normal rate and regular rhythm.  Pulmonary:     Effort: Pulmonary effort is normal.     Breath sounds: Normal breath sounds.  Skin:    General: Skin is warm and dry.     Capillary Refill: Capillary refill takes less than 2 seconds.  Neurological:     Mental Status: She is alert and oriented to person, place, and time.      No results found for any visits on 01/23/24.    The 10-year ASCVD risk score (Arnett DK, et al., 2019) is: 2.9%    Assessment & Plan:   Problem List Items Addressed This Visit        Endocrine   Type 2 diabetes mellitus with hyperglycemia (HCC) - Primary   Relevant Orders   POCT glycosylated hemoglobin (Hb A1C)    No follow-ups on file.    Judah Carchi, RN

## 2024-01-23 NOTE — Patient Instructions (Addendum)
Continue taking current medications   Follow up in 6 months

## 2024-01-23 NOTE — Progress Notes (Signed)
 Subjective:    Patient ID: Destiny West, female    DOB: 08-27-1970, 53 y.o.   MRN: [id number]  Destiny West is a very pleasant 53 y.o. female with a history of type 2 diabetes, hypertension, hyperlipidemia, obesity who presents today for follow up of diabetes.  Current medications include: Mounjaro  12.5 mg weekly, metformin  ER 500 mg daily.   She is checking her blood glucose 0 times daily.  Last A1C: 5.9 in March 2025, 5.4 today Last Eye Exam: Due Last Foot Exam: UTD Pneumonia Vaccination: 2023 Urine Microalbumin: UTD Statin: rosuvastatin    Dietary changes since last visit: Lean diet with protein and veggies mostly.    Exercise: No regular exercise.   BP Readings from Last 3 Encounters:  01/23/24 110/70  07/26/23 108/60  05/20/23 122/76    Wt Readings from Last 3 Encounters:  01/23/24 197 lb (89.4 kg)  07/26/23 209 lb (94.8 kg)  05/20/23 216 lb (98 kg)      Review of Systems  Eyes:  Negative for visual disturbance.  Respiratory:  Negative for shortness of breath.   Cardiovascular:  Negative for chest pain.  Neurological:  Negative for numbness.         Past Medical History:  Diagnosis Date   Anxiety    Asthma    Chicken pox    Diabetes mellitus without complication (HCC)    boderline   Hyperlipidemia    Hypertension     Social History   Socioeconomic History   Marital status: Married    Spouse name: Not on file   Number of children: Not on file   Years of education: Not on file   Highest education level: Bachelor's degree (e.g., BA, AB, BS)  Occupational History   Not on file  Tobacco Use   Smoking status: Never    Passive exposure: Past   Smokeless tobacco: Never  Vaping Use   Vaping status: Never Used  Substance and Sexual Activity   Alcohol use: Yes    Alcohol/week: 0.0 standard drinks of alcohol    Comment: rarely   Drug use: Never   Sexual activity: Not on file  Other Topics Concern   Not on file  Social History Narrative    Married.   No children.   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   Enjoys reading, traveling, movies.   Social Drivers of Corporate investment banker Strain: Low Risk  (05/19/2023)   Overall Financial Resource Strain (CARDIA)    Difficulty of Paying Living Expenses: Not very hard  Food Insecurity: No Food Insecurity (05/19/2023)   Hunger Vital Sign    Worried About Running Out of Food in the Last Year: Never true    Ran Out of Food in the Last Year: Never true  Transportation Needs: No Transportation Needs (05/19/2023)   PRAPARE - Administrator, Civil Service (Medical): No    Lack of Transportation (Non-Medical): No  Physical Activity: Insufficiently Active (05/19/2023)   Exercise Vital Sign    Days of Exercise per Week: 3 days    Minutes of Exercise per Session: 40 min  Stress: Stress Concern Present (05/19/2023)   [employer] of Occupational Health - Occupational Stress Questionnaire    Feeling of Stress : To some extent  Social Connections: Moderately Isolated (05/19/2023)   Social Connection and Isolation Panel    Frequency of Communication with Friends and Family: Once a week    Frequency  of Social Gatherings with Friends and Family: Never    Attends Religious Services: 1 to 4 times per year    Active Member of [employer] or Organizations: No    Attends Engineer, structural: Not on file    Marital Status: Married  Catering manager Violence: Not on file    Past Surgical History:  Procedure Laterality Date   BREAST LUMPECTOMY WITH RADIO FREQUENCY LOCALIZER Left 05/20/2021   Procedure: BREAST LUMPECTOMY WITH RADIO FREQUENCY LOCALIZER;  Surgeon: Lane Shope, MD;  Location: [facility] ORS;  Service: General;  Laterality: Left;   CATARACT EXTRACTION W/PHACO Left 06/10/2021   Procedure: CATARACT EXTRACTION PHACO AND INTRAOCULAR LENS PLACEMENT (IOC) LEFT DIABETIC 2.52 00:28.0;  Surgeon: Mittie Gaskin, MD;  Location: [facility];  Service: Ophthalmology;  Laterality: Left;   COLONOSCOPY WITH PROPOFOL  N/A 07/31/2021   Procedure: COLONOSCOPY WITH PROPOFOL ;  Surgeon: Unk Corinn Skiff, MD;  Location: [facility] ENDOSCOPY;  Service: Gastroenterology;  Laterality: N/A;   POLYPECTOMY  2012   Uterine    Family History  Problem Relation Age of Onset   Cancer Mother        breast   Breast cancer Mother 52   Heart disease Father    Hypertension Father    Heart disease Maternal Grandfather    Hypertension Maternal Grandfather    Diabetes Maternal Grandfather     No Known Allergies  Current Outpatient Medications on File Prior to Visit  Medication Sig Dispense Refill   amLODipine  (NORVASC ) 5 MG tablet TAKE 1 TABLET BY MOUTH EVERY DAY FOR BLOOD PRESSURE 90 tablet 2   Cholecalciferol (VITAMIN D ) 50 MCG (2000 UT) tablet Take 4,000 Units by mouth daily.     DUREZOL 0.05 % EMUL Place 1 drop into the left eye every other day.     hydrochlorothiazide (HYDRODIURIL) 25 MG tablet TAKE 1 TABLET BY MOUTH EVERY DAY FOR BLOOD PRESSURE 90 tablet 2   losartan  (COZAAR ) 100 MG tablet TAKE 1 TABLET BY MOUTH EVERY DAY FOR BLOOD PRESSURE 90 tablet 2   metFORMIN  (GLUCOPHAGE -XR) 500 MG 24 hr tablet TAKE 1 TABLET (500 MG TOTAL) BY MOUTH DAILY WITH BREAKFAST. FOR DIABETES. 90 tablet 0   Omega-3 Fatty Acids (FISH OIL) 1000 MG CAPS Take 1,000 mg by mouth in the morning and at bedtime.     rosuvastatin  (CRESTOR ) 10 MG tablet TAKE 1 TABLET BY MOUTH EVERY DAY FOR CHOLESTEROL 90 tablet 3   sertraline  (ZOLOFT ) 100 MG tablet TAKE 1 TABLET (100 MG TOTAL) BY MOUTH DAILY. FOR ANXIETY 90 tablet 2   sertraline  (ZOLOFT ) 25 MG tablet TAKE 1 TABLET (25 MG TOTAL) BY MOUTH DAILY. FOR ANXIETY AND DEPRESSION. TAKE WITH 100 MG DOSE. 90 tablet 2   tirzepatide  (MOUNJARO ) 12.5 MG/0.5ML Pen INJECT 12.5 MG INTO THE SKIN ONCE A WEEK. FOR DIABETES. 6 mL 0   valACYclovir  (VALTREX ) 500 MG tablet Take 500 mg by mouth daily.     albuterol  (PROAIR  HFA) 108 (90 Base) MCG/ACT  inhaler Inhale 2 puffs into the lungs every 6 (six) hours as needed for wheezing or shortness of breath. (Patient not taking: Reported on 01/23/2024) 1 each 0   [DISCONTINUED] simvastatin  (ZOCOR ) 20 MG tablet TAKE 1 TABLET BY MOUTH EVERY DAY IN THE EVENING 90 tablet 1   No current facility-administered medications on file prior to visit.    BP 110/70   Pulse 85   Temp (!) 97.3 F (36.3 C) (Temporal)   Ht 5' (1.524 m)   Wt 197 lb (  89.4 kg)   LMP 05/04/2023 (Approximate)   SpO2 96%   BMI 38.47 kg/m  Objective:   Physical Exam Cardiovascular:     Rate and Rhythm: Normal rate and regular rhythm.  Pulmonary:     Effort: Pulmonary effort is normal.     Breath sounds: Normal breath sounds.  Musculoskeletal:     Cervical back: Neck supple.  Skin:    General: Skin is warm and dry.  Neurological:     Mental Status: She is alert and oriented to person, place, and time.  Psychiatric:        Mood and Affect: Mood normal.     Physical Exam        Assessment & Plan:  Type 2 diabetes mellitus with hyperglycemia, without long-term current use of insulin (HCC) Assessment & Plan: Controlled.  Continue metformin  ER 500 mg daily and Mounjaro  12.5 mg weekly.  Consider discontinuation of metformin  at next visit.  Follow up in 6 months.  I evaluated patient, was consulted regarding treatment, and agree with assessment and plan per Kristin Rudd, MSN, FNP student.   Mallie Gaskins, NP-C   Orders: -     POCT glycosylated hemoglobin (Hb A1C)    Assessment and Plan Assessment & Plan         Comer MARLA Gaskins, NP    History of Present Illness

## 2024-01-23 NOTE — Assessment & Plan Note (Addendum)
 Controlled.  Continue metformin  ER 500 mg daily and Mounjaro  12.5 mg weekly.  Consider discontinuation of metformin  at next visit.  Follow up in 6 months.  I evaluated patient, was consulted regarding treatment, and agree with assessment and plan per Kamera Dubas, MSN, FNP student.   Mallie Gaskins, NP-C

## 2024-02-13 LAB — OPHTHALMOLOGY REPORT-SCANNED

## 2024-02-15 ENCOUNTER — Other Ambulatory Visit: Payer: Self-pay | Admitting: Primary Care

## 2024-02-15 DIAGNOSIS — Z1231 Encounter for screening mammogram for malignant neoplasm of breast: Secondary | ICD-10-CM

## 2024-02-19 ENCOUNTER — Other Ambulatory Visit: Payer: Self-pay | Admitting: Primary Care

## 2024-02-19 DIAGNOSIS — E1165 Type 2 diabetes mellitus with hyperglycemia: Secondary | ICD-10-CM

## 2024-03-19 ENCOUNTER — Ambulatory Visit
Admission: RE | Admit: 2024-03-19 | Discharge: 2024-03-19 | Disposition: A | Discharge status: Home / Self Care | Source: Ambulatory Visit | Attending: Primary Care | Admitting: Primary Care

## 2024-03-19 DIAGNOSIS — Z1231 Encounter for screening mammogram for malignant neoplasm of breast: Secondary | ICD-10-CM | POA: Diagnosis present

## 2024-03-21 ENCOUNTER — Ambulatory Visit: Payer: Self-pay | Admitting: Primary Care

## 2024-03-26 ENCOUNTER — Other Ambulatory Visit: Payer: Self-pay | Admitting: Primary Care

## 2024-03-26 DIAGNOSIS — E1165 Type 2 diabetes mellitus with hyperglycemia: Secondary | ICD-10-CM

## 2024-05-20 ENCOUNTER — Other Ambulatory Visit: Payer: Self-pay | Admitting: Primary Care

## 2024-05-20 DIAGNOSIS — F411 Generalized anxiety disorder: Secondary | ICD-10-CM

## 2024-05-26 ENCOUNTER — Other Ambulatory Visit: Payer: Self-pay | Admitting: Primary Care

## 2024-05-26 DIAGNOSIS — F411 Generalized anxiety disorder: Secondary | ICD-10-CM

## 2024-05-27 ENCOUNTER — Other Ambulatory Visit: Payer: Self-pay | Admitting: Primary Care

## 2024-05-27 DIAGNOSIS — I10 Essential (primary) hypertension: Secondary | ICD-10-CM

## 2024-06-06 ENCOUNTER — Other Ambulatory Visit: Payer: Self-pay | Admitting: Primary Care

## 2024-06-06 DIAGNOSIS — I10 Essential (primary) hypertension: Secondary | ICD-10-CM

## 2024-06-26 ENCOUNTER — Encounter: Admitting: Primary Care
